# Patient Record
Sex: Male | Born: 1955 | Race: Black or African American | Hispanic: No | Marital: Single | Smoking: Current every day smoker
Health system: Southern US, Community
[De-identification: ages and names within clinical notes are randomized; demographics above are authoritative.]

## PROBLEM LIST (undated history)

## (undated) DIAGNOSIS — I10 Essential (primary) hypertension: Secondary | ICD-10-CM

## (undated) DIAGNOSIS — F1011 Alcohol abuse, in remission: Secondary | ICD-10-CM

---

## 2007-10-27 ENCOUNTER — Ambulatory Visit: Payer: Self-pay | Admitting: Internal Medicine

## 2007-11-18 ENCOUNTER — Ambulatory Visit: Payer: Self-pay | Admitting: Internal Medicine

## 2007-11-20 ENCOUNTER — Ambulatory Visit: Payer: Self-pay | Admitting: *Deleted

## 2018-02-13 DIAGNOSIS — I10 Essential (primary) hypertension: Secondary | ICD-10-CM | POA: Insufficient documentation

## 2018-02-13 DIAGNOSIS — F10920 Alcohol use, unspecified with intoxication, uncomplicated: Secondary | ICD-10-CM | POA: Insufficient documentation

## 2018-02-13 DIAGNOSIS — W010XXA Fall on same level from slipping, tripping and stumbling without subsequent striking against object, initial encounter: Secondary | ICD-10-CM | POA: Insufficient documentation

## 2018-02-13 DIAGNOSIS — F17200 Nicotine dependence, unspecified, uncomplicated: Secondary | ICD-10-CM | POA: Insufficient documentation

## 2018-02-13 NOTE — ED Triage Notes (Addendum)
PER EMS, PT ARRIVED TO HIS WORK PLACE INTOXICATED AND STUMBLING AROUND. PT CALLED EMS. PT STS HE HAD 5 BEERS TODAY. PER PT, PT FELL ONTO RIGHT KNEE AT HIS WORK PLACE. PT STS THAT IS THE SAME KNEE HE NEEDS A "TRANSPLANT" ON. AAOX4 IN TRIAGE.

## 2018-02-14 ENCOUNTER — Encounter (HOSPITAL_COMMUNITY): Payer: Self-pay | Admitting: Emergency Medicine

## 2018-02-14 ENCOUNTER — Other Ambulatory Visit: Payer: Self-pay

## 2018-02-14 ENCOUNTER — Emergency Department (HOSPITAL_COMMUNITY)
Admission: EM | Admit: 2018-02-14 | Discharge: 2018-02-14 | Disposition: A | Discharge status: Home / Self Care | Payer: Self-pay | Attending: Emergency Medicine | Admitting: Emergency Medicine

## 2018-02-14 DIAGNOSIS — F10920 Alcohol use, unspecified with intoxication, uncomplicated: Secondary | ICD-10-CM

## 2018-02-14 DIAGNOSIS — W19XXXA Unspecified fall, initial encounter: Secondary | ICD-10-CM

## 2018-02-14 HISTORY — DX: Essential (primary) hypertension: I10

## 2018-02-14 NOTE — ED Provider Notes (Signed)
[facility]-EMERGENCY DEPT Provider Note   CSN: [id number] Arrival date & time: 02/13/18  2234     History   Chief Complaint No chief complaint on file.   HPI William Pruitt is a 62 y.o. male.  Patient presents via EMS after a fall earlier tonight while at work. The patient denies injury or pain. He feels he lost his balance as the cause of the fall. He admits to drinking multiple beers today and that he drinks daily. No chest pain, SOB, recent illness, vomiting, diarrhea.   The history is provided by the patient. No language interpreter was used.    Past Medical History:  Diagnosis Date  . Hypertension     There are no active problems to display for this patient.   History reviewed. No pertinent surgical history.      Home Medications    Prior to Admission medications   Not on File    Family History History reviewed. No pertinent family history.  Social History Social History   Tobacco Use  . Smoking status: Current Every Day Smoker  . Smokeless tobacco: Never Used  Substance Use Topics  . Alcohol use: Yes    Comment: 4 beers per day  . Drug use: Not Currently     Allergies   Patient has no known allergies.   Review of Systems Review of Systems  Constitutional: Negative for chills and fever.  HENT: Negative.   Respiratory: Negative.   Cardiovascular: Negative.   Gastrointestinal: Negative.   Musculoskeletal: Negative.   Skin: Negative.   Neurological: Negative.      Physical Exam Updated Vital Signs BP (!) 156/102 (BP Location: Left Arm)   Pulse 77   Temp 98.1 F (36.7 C) (Oral)   Resp 20   SpO2 95%   Physical Exam  Constitutional: He is oriented to person, place, and time. He appears well-developed and well-nourished. No distress.  HENT:  Head: Normocephalic and atraumatic.  Neck: Normal range of motion. Neck supple.  Cardiovascular: Normal rate and regular rhythm.  Pulmonary/Chest: Effort normal and breath  sounds normal. He has no wheezes. He has no rales.  Abdominal: Soft. Bowel sounds are normal. There is no tenderness. There is no rebound and no guarding.  Musculoskeletal: Normal range of motion.  Neurological: He is alert and oriented to person, place, and time.  Skin: Skin is warm and dry. No rash noted.  Psychiatric: He has a normal mood and affect.  Nursing note and vitals reviewed.    ED Treatments / Results  Labs (all labs ordered are listed, but only abnormal results are displayed) Labs Reviewed - No data to display  EKG None  Radiology No results found.  Procedures Procedures (including critical care time)  Medications Ordered in ED Medications - No data to display   Initial Impression / Assessment and Plan / ED Course  I have reviewed the triage vital signs and the nursing notes.  Pertinent labs & imaging results that were available during my care of the patient were reviewed by me and considered in my medical decision making (see chart for details).     Patient is here after fall without apparent injury. The patient is intoxicated but coherent and oriented. He is ambulatory without unsteadiness. He is eating and drinking in the ED.   He is felt stable for discharge home.   Final Clinical Impressions(s) / ED Diagnoses   Final diagnoses:  None    ED Discharge Orders  None       Elpidio Anis, PA-C 02/14/18 0344    Paula Libra, MD 02/14/18 [phone]

## 2018-02-14 NOTE — ED Notes (Signed)
Pt states he was told he passed out at work and he didn't know why.

## 2018-02-14 NOTE — Discharge Instructions (Addendum)
Included with your discharge instructions are resources for outpatient alcohol treatment options if you are interested in stopping drinking. You can be discharged home and should follow up with your doctor as needed.

## 2019-03-24 ENCOUNTER — Encounter (HOSPITAL_COMMUNITY): Payer: Self-pay

## 2019-03-24 ENCOUNTER — Emergency Department (HOSPITAL_COMMUNITY): Payer: Self-pay

## 2019-03-24 ENCOUNTER — Other Ambulatory Visit: Payer: Self-pay

## 2019-03-24 ENCOUNTER — Emergency Department (HOSPITAL_COMMUNITY)
Admission: EM | Admit: 2019-03-24 | Discharge: 2019-03-24 | Disposition: A | Discharge status: Home / Self Care | Payer: Self-pay | Attending: Emergency Medicine | Admitting: Emergency Medicine

## 2019-03-24 DIAGNOSIS — W19XXXA Unspecified fall, initial encounter: Secondary | ICD-10-CM

## 2019-03-24 DIAGNOSIS — I10 Essential (primary) hypertension: Secondary | ICD-10-CM | POA: Insufficient documentation

## 2019-03-24 DIAGNOSIS — S63501A Unspecified sprain of right wrist, initial encounter: Secondary | ICD-10-CM | POA: Insufficient documentation

## 2019-03-24 DIAGNOSIS — F10920 Alcohol use, unspecified with intoxication, uncomplicated: Secondary | ICD-10-CM | POA: Insufficient documentation

## 2019-03-24 DIAGNOSIS — Y9301 Activity, walking, marching and hiking: Secondary | ICD-10-CM | POA: Insufficient documentation

## 2019-03-24 DIAGNOSIS — Y92488 Other paved roadways as the place of occurrence of the external cause: Secondary | ICD-10-CM | POA: Insufficient documentation

## 2019-03-24 DIAGNOSIS — F17200 Nicotine dependence, unspecified, uncomplicated: Secondary | ICD-10-CM | POA: Insufficient documentation

## 2019-03-24 DIAGNOSIS — Y998 Other external cause status: Secondary | ICD-10-CM | POA: Insufficient documentation

## 2019-03-24 DIAGNOSIS — W010XXA Fall on same level from slipping, tripping and stumbling without subsequent striking against object, initial encounter: Secondary | ICD-10-CM | POA: Insufficient documentation

## 2019-03-24 NOTE — ED Provider Notes (Signed)
[facility] Provider Note   CSN: [id number] Arrival date & time: 03/24/19  1430     History   Chief Complaint Chief Complaint  Patient presents with  . ETOH/.Fall    HPI William Pruitt is a 63 y.o. male who presents with a fall.  Past medical history significant for hypertension.  The patient states that he was walking across the road to get another beer and he tripped and fell over a pallet.  He states that bystanders were concerned because he was able unable to get himself up.  He states that he is having some pain in his right wrist.  He thinks he may have arthritis in that wrist which is why he was not able to get up.  He has been drinking today -he states he drank 1 beer but it was a big one.  He denies head injury, loss of consciousness, dizziness, neck pain, chest pain, shortness of breath, abdominal pain, left upper extremity pain, lower extremity pain.  He is not on any blood thinners.     HPI  Past Medical History:  Diagnosis Date  . Hypertension     There are no active problems to display for this patient.   History reviewed. No pertinent surgical history.      Home Medications    Prior to Admission medications   Not on File    Family History No family history on file.  Social History Social History   Tobacco Use  . Smoking status: Current Every Day Smoker  . Smokeless tobacco: Never Used  Substance Use Topics  . Alcohol use: Yes    Comment: 4 beers per day  . Drug use: Not Currently     Allergies   Patient has no known allergies.   Review of Systems Review of Systems  Constitutional: Negative for fever.  Respiratory: Negative for shortness of breath.   Cardiovascular: Negative for chest pain.  Gastrointestinal: Negative for abdominal pain.  Musculoskeletal: Positive for arthralgias. Negative for back pain and myalgias.  Neurological: Negative for weakness and headaches.  All other systems reviewed  and are negative.    Physical Exam Updated Vital Signs BP 131/81 (BP Location: Right Arm)   Pulse 79   Temp 98 F (36.7 C) (Oral)   Resp 19   SpO2 94%   Physical Exam Vitals signs and nursing note reviewed.  Constitutional:      General: He is not in acute distress.    Appearance: Normal appearance. He is well-developed. He is not ill-appearing.     Comments: Disheveled male in NAD. In C-collar. Mildly intoxicated  HENT:     Head: Normocephalic and atraumatic.  Eyes:     General: No scleral icterus.       Right eye: No discharge.        Left eye: No discharge.     Pupils: Pupils are equal, round, and reactive to light.     Comments: Brown sclera  Neck:     Musculoskeletal: Normal range of motion.  Cardiovascular:     Rate and Rhythm: Normal rate and regular rhythm.  Pulmonary:     Effort: Pulmonary effort is normal. No respiratory distress.     Breath sounds: Normal breath sounds.  Abdominal:     General: There is no distension.     Palpations: Abdomen is soft.     Tenderness: There is no abdominal tenderness.  Musculoskeletal:     Comments: Normal ROM of  the bilateral shoulders, elbow, wrists. 2+ radial pulses bilaterally.  Normal ROM of the bilateral hips and knees. No tenderness  Skin:    General: Skin is warm and dry.  Neurological:     Mental Status: He is alert and oriented to person, place, and time.  Psychiatric:        Mood and Affect: Mood normal.        Behavior: Behavior normal.      ED Treatments / Results  Labs (all labs ordered are listed, but only abnormal results are displayed) Labs Reviewed - No data to display  EKG None  Radiology No results found.  Procedures Procedures (including critical care time)  Medications Ordered in ED Medications - No data to display   Initial Impression / Assessment and Plan / ED Course  I have reviewed the triage vital signs and the nursing notes.  Pertinent labs & imaging results that were  available during my care of the patient were reviewed by me and considered in my medical decision making (see chart for details).  63 year old male presents with a fall and alcohol intoxication. He states his right wrist is hurting him a little but nothing else is bothering him today. There was no head injury. Vitals are normal. Will order Xray and have tech ambulate him.  5PM Pt was ambulated and was unsteady. Will continue to monitor.  6:12 PM Pt is asking to leave. He was able to ambulate to the bathroom without assistance per nursing. Xray is negative for fracture. He was given a wrist splint. Will d/c.   Final Clinical Impressions(s) / ED Diagnoses   Final diagnoses:  Fall, initial encounter  Alcoholic intoxication without complication ([place])  Sprain of right wrist, initial encounter    ED Discharge Orders    None       Recardo Evangelist, PA-C 03/24/19 1814    Gareth Morgan, MD 03/26/19 [phone]

## 2019-03-24 NOTE — ED Triage Notes (Signed)
Patient arrived by [facility]. Patient with ETOH and fell out of car he lives in. Denies injury.

## 2019-03-24 NOTE — ED Notes (Signed)
Tried ambulating pt from wheelchair to bed, pt stumbled this NT caught pt before falling and placed pt back into bed. Notified Kelly(PA)

## 2019-11-14 ENCOUNTER — Encounter (HOSPITAL_COMMUNITY): Payer: Self-pay | Admitting: Emergency Medicine

## 2019-11-14 ENCOUNTER — Other Ambulatory Visit: Payer: Self-pay

## 2019-11-14 ENCOUNTER — Emergency Department (HOSPITAL_COMMUNITY): Payer: Medicaid Other

## 2019-11-14 ENCOUNTER — Inpatient Hospital Stay (HOSPITAL_COMMUNITY)
Admission: EM | Admit: 2019-11-14 | Discharge: 2019-11-22 | DRG: 177 | Disposition: A | Discharge status: Home / Self Care | Payer: Medicaid Other | Attending: Internal Medicine | Admitting: Internal Medicine

## 2019-11-14 ENCOUNTER — Inpatient Hospital Stay (HOSPITAL_COMMUNITY): Payer: Medicaid Other

## 2019-11-14 DIAGNOSIS — E871 Hypo-osmolality and hyponatremia: Secondary | ICD-10-CM | POA: Diagnosis present

## 2019-11-14 DIAGNOSIS — R7401 Elevation of levels of liver transaminase levels: Secondary | ICD-10-CM | POA: Diagnosis present

## 2019-11-14 DIAGNOSIS — I10 Essential (primary) hypertension: Secondary | ICD-10-CM | POA: Diagnosis not present

## 2019-11-14 DIAGNOSIS — F17290 Nicotine dependence, other tobacco product, uncomplicated: Secondary | ICD-10-CM | POA: Diagnosis present

## 2019-11-14 DIAGNOSIS — J1282 Pneumonia due to coronavirus disease 2019: Secondary | ICD-10-CM

## 2019-11-14 DIAGNOSIS — Z590 Homelessness: Secondary | ICD-10-CM | POA: Diagnosis not present

## 2019-11-14 DIAGNOSIS — Z209 Contact with and (suspected) exposure to unspecified communicable disease: Secondary | ICD-10-CM | POA: Diagnosis not present

## 2019-11-14 DIAGNOSIS — F1020 Alcohol dependence, uncomplicated: Secondary | ICD-10-CM | POA: Diagnosis present

## 2019-11-14 DIAGNOSIS — J9601 Acute respiratory failure with hypoxia: Secondary | ICD-10-CM | POA: Diagnosis present

## 2019-11-14 DIAGNOSIS — R0602 Shortness of breath: Secondary | ICD-10-CM

## 2019-11-14 DIAGNOSIS — N179 Acute kidney failure, unspecified: Secondary | ICD-10-CM | POA: Diagnosis present

## 2019-11-14 DIAGNOSIS — F1010 Alcohol abuse, uncomplicated: Secondary | ICD-10-CM | POA: Diagnosis not present

## 2019-11-14 DIAGNOSIS — R05 Cough: Secondary | ICD-10-CM | POA: Diagnosis not present

## 2019-11-14 DIAGNOSIS — U071 COVID-19: Secondary | ICD-10-CM | POA: Diagnosis present

## 2019-11-14 HISTORY — DX: Alcohol abuse, in remission: F10.11

## 2019-11-14 LAB — D-DIMER, QUANTITATIVE: D-Dimer, Quant: 1.9 ug/mL-FEU — ABNORMAL HIGH (ref 0.00–0.50)

## 2019-11-14 LAB — COMPREHENSIVE METABOLIC PANEL
ALT: 40 U/L (ref 0–44)
AST: 51 U/L — ABNORMAL HIGH (ref 15–41)
Albumin: 2.3 g/dL — ABNORMAL LOW (ref 3.5–5.0)
Alkaline Phosphatase: 38 U/L (ref 38–126)
Anion gap: 13 (ref 5–15)
BUN: 30 mg/dL — ABNORMAL HIGH (ref 8–23)
CO2: 18 mmol/L — ABNORMAL LOW (ref 22–32)
Calcium: 8.8 mg/dL — ABNORMAL LOW (ref 8.9–10.3)
Chloride: 97 mmol/L — ABNORMAL LOW (ref 98–111)
Creatinine, Ser: 1.55 mg/dL — ABNORMAL HIGH (ref 0.61–1.24)
GFR calc Af Amer: 54 mL/min — ABNORMAL LOW (ref >60)
GFR calc non Af Amer: 46 mL/min — ABNORMAL LOW (ref >60)
Glucose, Bld: 118 mg/dL — ABNORMAL HIGH (ref 70–99)
Potassium: 3.7 mmol/L (ref 3.5–5.1)
Sodium: 128 mmol/L — ABNORMAL LOW (ref 135–145)
Total Bilirubin: 0.8 mg/dL (ref 0.3–1.2)
Total Protein: 7.3 g/dL (ref 6.5–8.1)

## 2019-11-14 LAB — POC SARS CORONAVIRUS 2 AG -  ED: SARS Coronavirus 2 Ag: POSITIVE — AB

## 2019-11-14 LAB — CBC WITH DIFFERENTIAL/PLATELET
Abs Immature Granulocytes: 0 10*3/uL (ref 0.00–0.07)
Basophils Absolute: 0 10*3/uL (ref 0.0–0.1)
Basophils Relative: 0 %
Eosinophils Absolute: 0 10*3/uL (ref 0.0–0.5)
Eosinophils Relative: 0 %
HCT: 41 % (ref 39.0–52.0)
Hemoglobin: 13.9 g/dL (ref 13.0–17.0)
Lymphocytes Relative: 4 %
Lymphs Abs: 0.4 10*3/uL — ABNORMAL LOW (ref 0.7–4.0)
MCH: 30.2 pg (ref 26.0–34.0)
MCHC: 33.9 g/dL (ref 30.0–36.0)
MCV: 89.1 fL (ref 80.0–100.0)
Monocytes Absolute: 1.2 10*3/uL — ABNORMAL HIGH (ref 0.1–1.0)
Monocytes Relative: 12 %
Neutro Abs: 8.4 10*3/uL — ABNORMAL HIGH (ref 1.7–7.7)
Neutrophils Relative %: 84 %
Platelets: 391 10*3/uL (ref 150–400)
RBC: 4.6 MIL/uL (ref 4.22–5.81)
RDW: 12.3 % (ref 11.5–15.5)
WBC: 10 10*3/uL (ref 4.0–10.5)
nRBC: 0 % (ref 0.0–0.2)
nRBC: 0 /100 WBC

## 2019-11-14 LAB — TRIGLYCERIDES: Triglycerides: 119 mg/dL (ref <150)

## 2019-11-14 LAB — LACTATE DEHYDROGENASE: LDH: 318 U/L — ABNORMAL HIGH (ref 98–192)

## 2019-11-14 LAB — FERRITIN: Ferritin: 1068 ng/mL — ABNORMAL HIGH (ref 24–336)

## 2019-11-14 LAB — FIBRINOGEN: Fibrinogen: >800 mg/dL — ABNORMAL HIGH (ref 210–475)

## 2019-11-14 LAB — LACTIC ACID, PLASMA: Lactic Acid, Venous: 1.1 mmol/L (ref 0.5–1.9)

## 2019-11-14 LAB — PROCALCITONIN: Procalcitonin: 0.45 ng/mL

## 2019-11-14 LAB — C-REACTIVE PROTEIN: CRP: 13.6 mg/dL — ABNORMAL HIGH (ref <1.0)

## 2019-11-14 LAB — ABO/RH: ABO/RH(D): A POS

## 2019-11-14 MED ORDER — SODIUM CHLORIDE 0.45 % IV SOLN: INTRAVENOUS | Status: DC

## 2019-11-14 MED ORDER — FOLIC ACID 1 MG PO TABS
1.0000 mg | ORAL_TABLET | Freq: Every day | ORAL | Status: DC
  Administered 2019-11-14 – 2019-11-22 (×9): 1 mg via ORAL
  Filled 2019-11-14 (×9): qty 1

## 2019-11-14 MED ORDER — ACETAMINOPHEN 325 MG PO TABS: 650.0000 mg | ORAL_TABLET | Freq: Four times a day (QID) | ORAL | Status: DC | PRN

## 2019-11-14 MED ORDER — TOCILIZUMAB 400 MG/20ML IV SOLN
700.0000 mg | Freq: Once | INTRAVENOUS | Status: DC
  Filled 2019-11-14: qty 35

## 2019-11-14 MED ORDER — SODIUM CHLORIDE 0.9 % IV SOLN
100.0000 mg | Freq: Every day | INTRAVENOUS | Status: AC
  Administered 2019-11-15 – 2019-11-18 (×4): 100 mg via INTRAVENOUS
  Filled 2019-11-14 (×4): qty 20

## 2019-11-14 MED ORDER — ENOXAPARIN SODIUM 40 MG/0.4ML ~~LOC~~ SOLN
40.0000 mg | SUBCUTANEOUS | Status: DC
  Administered 2019-11-14 – 2019-11-21 (×8): 40 mg via SUBCUTANEOUS
  Filled 2019-11-14 (×9): qty 0.4

## 2019-11-14 MED ORDER — ONDANSETRON HCL 4 MG PO TABS
4.0000 mg | ORAL_TABLET | Freq: Four times a day (QID) | ORAL | Status: DC | PRN
  Filled 2019-11-14: qty 1

## 2019-11-14 MED ORDER — DOCUSATE SODIUM 100 MG PO CAPS
100.0000 mg | ORAL_CAPSULE | Freq: Two times a day (BID) | ORAL | Status: DC
  Administered 2019-11-15 – 2019-11-22 (×15): 100 mg via ORAL
  Filled 2019-11-14 (×15): qty 1

## 2019-11-14 MED ORDER — SODIUM CHLORIDE 0.9 % IV SOLN
200.0000 mg | Freq: Once | INTRAVENOUS | Status: AC
  Administered 2019-11-14: 200 mg via INTRAVENOUS
  Filled 2019-11-14: qty 200

## 2019-11-14 MED ORDER — THIAMINE HCL 100 MG PO TABS
100.0000 mg | ORAL_TABLET | Freq: Every day | ORAL | Status: DC
  Administered 2019-11-14 – 2019-11-22 (×9): 100 mg via ORAL
  Filled 2019-11-14 (×9): qty 1

## 2019-11-14 MED ORDER — AMLODIPINE BESYLATE 5 MG PO TABS
5.0000 mg | ORAL_TABLET | Freq: Every day | ORAL | Status: DC
  Administered 2019-11-14 – 2019-11-15 (×2): 5 mg via ORAL
  Filled 2019-11-14 (×2): qty 1

## 2019-11-14 MED ORDER — ONDANSETRON HCL 4 MG/2ML IJ SOLN: 4.0000 mg | Freq: Four times a day (QID) | INTRAMUSCULAR | Status: DC | PRN

## 2019-11-14 MED ORDER — ADULT MULTIVITAMIN W/MINERALS CH
1.0000 | ORAL_TABLET | Freq: Every day | ORAL | Status: DC
  Administered 2019-11-14 – 2019-11-22 (×9): 1 via ORAL
  Filled 2019-11-14 (×9): qty 1

## 2019-11-14 MED ORDER — DEXAMETHASONE 6 MG PO TABS
6.0000 mg | ORAL_TABLET | ORAL | Status: AC
  Administered 2019-11-14 – 2019-11-17 (×4): 6 mg via ORAL
  Filled 2019-11-14: qty 1
  Filled 2019-11-14: qty 2
  Filled 2019-11-14 (×2): qty 1

## 2019-11-14 MED ORDER — TRAZODONE HCL 50 MG PO TABS
25.0000 mg | ORAL_TABLET | Freq: Every evening | ORAL | Status: DC | PRN
  Administered 2019-11-15: 21:00:00 25 mg via ORAL
  Filled 2019-11-14: qty 1

## 2019-11-14 NOTE — ED Triage Notes (Signed)
C/o cough and fever x 1 week.  Landlord's mother tested + for COVID and was in hospital.  Landlord threatened to kick him out if he didn't go to hospital and get a rapid COVID test.  EMS administered Tylenol 1000mg  for fever 101.6.  CBG 134.

## 2019-11-14 NOTE — ED Provider Notes (Signed)
[facility] EMERGENCY DEPARTMENT Provider Note   CSN: [id number] Arrival date & time: 11/14/19  1244     History Chief Complaint  Patient presents with  . ? COVID  . Cough  . Fever    William Pruitt is a 64 y.o. male.  Patient is a 64 year old gentleman with past medical history of hypertension presenting to the emergency department for Covid test.  Patient reports that he lives in a group home and that one of the members mother came to visit and she was positive for COVID-19.  He tells me that he has been feeling generally well but has a recorded fever at home. Denies GI symptoms. Reports occasional cough.        Past Medical History:  Diagnosis Date  . Hypertension     There are no problems to display for this patient.   History reviewed. No pertinent surgical history.     No family history on file.  Social History   Tobacco Use  . Smoking status: Current Every Day Smoker  . Smokeless tobacco: Never Used  Substance Use Topics  . Alcohol use: Yes    Comment: 4 beers per day  . Drug use: Not Currently    Home Medications Prior to Admission medications   Not on File    Allergies    Patient has no known allergies.  Review of Systems   Review of Systems  Constitutional: Negative for appetite change, chills, fatigue and fever.  HENT: Negative for congestion and sore throat.   Eyes: Negative for redness and visual disturbance.  Respiratory: Negative for cough and shortness of breath.   Cardiovascular: Negative for chest pain.  Gastrointestinal: Negative for abdominal pain, nausea and vomiting.  Genitourinary: Negative for dysuria.  Musculoskeletal: Negative for arthralgias and back pain.  Skin: Negative for rash.  Allergic/Immunologic: Negative for immunocompromised state.  Neurological: Negative for dizziness, light-headedness and headaches.  All other systems reviewed and are negative.   Physical Exam Updated Vital Signs BP (!)  148/90   Pulse 96   Temp 99 F (37.2 C) (Oral)   Resp (!) 22   SpO2 92%   Physical Exam Vitals and nursing note reviewed.  Constitutional:      General: He is not in acute distress.    Appearance: Normal appearance. He is not ill-appearing, toxic-appearing or diaphoretic.     Comments: Patient is unkempt and discheveled  HENT:     Head: Normocephalic.     Mouth/Throat:     Mouth: Mucous membranes are moist.  Eyes:     Conjunctiva/sclera: Conjunctivae normal.  Cardiovascular:     Rate and Rhythm: Normal rate and regular rhythm.  Pulmonary:     Effort: Pulmonary effort is normal.     Breath sounds: Normal breath sounds. No wheezing.  Chest:     Chest wall: No tenderness.  Abdominal:     General: Abdomen is flat.  Musculoskeletal:     Right lower leg: No edema.     Left lower leg: No edema.  Skin:    General: Skin is dry.     Capillary Refill: Capillary refill takes less than 2 seconds.  Neurological:     Mental Status: He is alert.  Psychiatric:        Mood and Affect: Mood normal.     ED Results / Procedures / Treatments   Labs (all labs ordered are listed, but only abnormal results are displayed) Labs Reviewed  POC SARS CORONAVIRUS  2 AG -  ED - Abnormal; Notable for the following components:      Result Value   SARS Coronavirus 2 Ag POSITIVE (*)    All other components within normal limits  CULTURE, BLOOD (ROUTINE X 2)  CULTURE, BLOOD (ROUTINE X 2)  CBC WITH DIFFERENTIAL/PLATELET  CBC WITH DIFFERENTIAL/PLATELET  LACTIC ACID, PLASMA  LACTIC ACID, PLASMA  D-DIMER, QUANTITATIVE (NOT AT [facility])  PROCALCITONIN  LACTATE DEHYDROGENASE  FERRITIN  FIBRINOGEN  C-REACTIVE PROTEIN  COMPREHENSIVE METABOLIC PANEL  HIV ANTIBODY (ROUTINE TESTING W REFLEX)  TRIGLYCERIDES  ABO/RH    EKG EKG Interpretation  Date/Time:  Sunday November 14 2019 13:14:34 EST Ventricular Rate:  94 PR Interval:    QRS Duration: 75 QT Interval:  366 QTC Calculation: 458 R Axis:   60  Text Interpretation: Sinus rhythm LAE, consider biatrial enlargement Left ventricular hypertrophy No old tracing to compare Confirmed by Jacalyn Lefevre [phone]) on 11/14/2019 3:21:06 PM   Radiology DG Chest Portable 1 View  Result Date: 11/14/2019 CLINICAL DATA:  Fever and cough for 1 week EXAM: PORTABLE CHEST 1 VIEW COMPARISON:  None. FINDINGS: The heart size and mediastinal contours are within normal limits accounting for technique. Mild to moderate bilateral lower lung predominant interstitial and airspace opacities are seen. There is a mild left basilar atelectasis. There is no pleural effusion or pneumothorax. The visualized skeletal structures are unremarkable. IMPRESSION: Mild to moderate bilateral lower lung predominant interstitial and airspace opacities are nonspecific but may reflect COVID-19 pneumonia. Electronically Signed   By: Romona Curls M.D.   On: 11/14/2019 14:58    Procedures Procedures (including critical care time)  Medications Ordered in ED Medications - No data to display  ED Course  I have reviewed the triage vital signs and the nursing notes.  Pertinent labs & imaging results that were available during my care of the patient were reviewed by me and considered in my medical decision making (see chart for details).  Clinical Course as of Nov 13 1541  [name] Nov 14, 2019  1435 Patient with Covid exposure presenting to the ED for cough and fever.  Oxygen saturations 93% on room air.  When I ambulate the patient he drops to 86% and has mild tachycardia and is tachypneic.  Point-of-care Covid test positive.  Labs and chest x-ray pending.   [KM]  1510 Xray consistent with covid 19. Will consult hospital for admission. Patient remaining stable 94% on RA   [KM]  1542 Patient will be transferred to [facility], admit by Dr. Ranell Patrick   [KM]    Clinical Course User Index [KM] [name]   MDM Rules/Calculators/A&P                      The patient appears  reasonably stabilized for admission considering the current resources, flow, and capabilities available in the ED at this time, and I doubt any other [facility] requiring further screening and/or treatment in the ED prior to admission.  Final Clinical Impression(s) / ED Diagnoses Final diagnoses:  COVID-19    Rx / DC Orders ED Discharge Orders    None       [name] 11/14/19 1543    Margarita Grizzle, MD 11/14/19 [phone]

## 2019-11-14 NOTE — H&P (Signed)
History and Physical    William Pruitt ZOX:[id number] DOB: 12-20-1955 DOA: 11/14/2019  PCP: Patient, No Pcp Per (Confirm with patient/family/NH records and if not entered, this has to be entered at [facility] point of entry) Patient coming from: boarding house  I have personally briefly reviewed patient's old medical records in [facility]  Chief Complaint: covid exposure, mild cough  HPI: William Pruitt is a 64 y.o. male with medical history significant of HTN who reports that a resident of his boarding house tested positive for Covid 19. He was not feeling sick but does endorse mild diarrhea, mild cough. He presented to [facility] ED to be tested. (For level 3, the HPI must include 4+ descriptors: Location, Quality, Severity, Duration, Timing, Context, modifying factors, associated signs/symptoms and/or status of 3+ chronic problems.)  (Please avoid self-populating past medical history here) (The initial 2-3 lines should be focused and good to copy and paste in the HPI section of the daily progress note).  ED Course: Mild hypertension noted. Lab revealed covid positive by PCR, elevated inflammatory markers with LDH 318, ferritin 1,068, CRP 13.6. CXR with bilateral lower lobe infiltrates c/w viral pneumonia. Patient was put to oxygen by nasal cannula at 2L/min. He is now admitted to [facility] for management of covid 19 [place].  Review of Systems: As per HPI otherwise 10 point review of systems negative.  Unacceptable ROS statements: "10 systems reviewed," "Extensive" (without elaboration).  Acceptable ROS statements: "All others negative," "All others reviewed and are negative," and "All others unremarkable," with at LEAST ONE ROS documented Can't double dip - if using for HPI can't use for ROS  Past Medical History:  Diagnosis Date  . H/O ETOH abuse   . Hypertension     History reviewed. No pertinent surgical history.   reports that he has been smoking. He has never used smokeless tobacco. He  reports current alcohol use. He reports previous drug use.  Last drink approximately 2 days ago - consumption limited by lack of funds Smokes two cigarillos a day - limited smoking due to lack of funds.  No Known Allergies  No family history on file.   Soc Hx - [employer], 2 years of college. 6 Years [place] [employer] - honorable discharge with rank of sargent. Never married. Has one daughter from whom he is estranged. Worked in Aeronautical engineer and other jobs. Lives in a boarding house. Gets care for HTN from [place].  Unacceptable: Noncontributory, unremarkable, or negative. Acceptable: Family history reviewed and not pertinent (If you reviewed it)  Prior to Admission medications   Not on File    Physical Exam: Vitals:   11/14/19 1255 11/14/19 1315 11/14/19 1345 11/14/19 1430  BP: (!) 154/93 (!) 151/93 (!) 152/96 (!) 148/90  Pulse: (!) 103 94 86 96  Resp: 18  (!) 22 (!) 22  Temp: 99 F (37.2 C)     TempSrc: Oral     SpO2: 94% 94% 93% 92%    Constitutional: NAD, calm, comfortable Vitals:   11/14/19 1255 11/14/19 1315 11/14/19 1345 11/14/19 1430  BP: (!) 154/93 (!) 151/93 (!) 152/96 (!) 148/90  Pulse: (!) 103 94 86 96  Resp: 18  (!) 22 (!) 22  Temp: 99 F (37.2 C)     TempSrc: Oral     SpO2: 94% 94% 93% 92%   General  WNWD man in no distress Eyes: PERRL, lids  Normal. Bilateral arcus senilis. Bulbar conjunctiva muddy ENMT: Mucous membranes are moist. Posterior pharynx clear of any  exudate or lesions. Missing many teeth but has front teeth mandible and maxilla..  Neck: normal, supple, no masses, no thyromegaly Respiratory: mild increased WOB using abdominal muscles, no neck retractions. Normal air movement with bilateral basilar rales, no wheezing. Cardiovascular: Regular rate and rhythm, no murmurs / rubs / gallops. No extremity edema. 2+ pedal pulses. No carotid bruits.  Abdomen: no tenderness, no masses palpated. No hepatosplenomegaly. Bowel sounds positive.  Musculoskeletal: no clubbing /  cyanosis. No joint deformity upper and lower extremities. Good ROM, no contractures. Normal muscle tone.  Skin: no rashes, lesions, ulcers. No induration Neurologic: CN 2-12 grossly intact. Sensation intact.. Strength 5/5 in all 4.  Psychiatric: Normal judgment and insight. Alert and oriented x 3. Normal mood.   (Anything < 9 systems with 2 bullets each down codes to level 1) (If patient refuses exam can't bill higher level) (Make sure to document decubitus ulcers present on admission -- if possible -- and whether patient has chronic indwelling catheter at time of admission)  Labs on Admission: I have personally reviewed following labs and imaging studies  CBC: Recent Labs  Lab 11/14/19 1452  WBC 10.0  NEUTROABS 8.4*  HGB 13.9  HCT 41.0  MCV 89.1  PLT 024   Basic Metabolic Panel: Recent Labs  Lab 11/14/19 1452  NA 128*  K 3.7  CL 97*  CO2 18*  GLUCOSE 118*  BUN 30*  CREATININE 1.55*  CALCIUM 8.8*   GFR: CrCl cannot be calculated (Unknown ideal weight.). Liver Function Tests: Recent Labs  Lab 11/14/19 1452  AST 51*  ALT 40  ALKPHOS 38  BILITOT 0.8  PROT 7.3  ALBUMIN 2.3*   No results for input(s): LIPASE, AMYLASE in the last 168 hours. No results for input(s): AMMONIA in the last 168 hours. Coagulation Profile: No results for input(s): INR, PROTIME in the last 168 hours. Cardiac Enzymes: No results for input(s): CKTOTAL, CKMB, CKMBINDEX, TROPONINI in the last 168 hours. BNP (last 3 results) No results for input(s): PROBNP in the last 8760 hours. HbA1C: No results for input(s): HGBA1C in the last 72 hours. CBG: No results for input(s): GLUCAP in the last 168 hours. Lipid Profile: Recent Labs    11/14/19 1452  TRIG 119   Thyroid Function Tests: No results for input(s): TSH, T4TOTAL, FREET4, T3FREE, THYROIDAB in the last 72 hours. Anemia Panel: Recent Labs    11/14/19 1452  FERRITIN 1,068*   Urine analysis: No results found for: COLORURINE,  APPEARANCEUR, LABSPEC, PHURINE, GLUCOSEU, HGBUR, BILIRUBINUR, KETONESUR, PROTEINUR, UROBILINOGEN, NITRITE, LEUKOCYTESUR  Radiological Exams on Admission: DG Chest Portable 1 View  Result Date: 11/14/2019 CLINICAL DATA:  Fever and cough for 1 week EXAM: PORTABLE CHEST 1 VIEW COMPARISON:  None. FINDINGS: The heart size and mediastinal contours are within normal limits accounting for technique. Mild to moderate bilateral lower lung predominant interstitial and airspace opacities are seen. There is a mild left basilar atelectasis. There is no pleural effusion or pneumothorax. The visualized skeletal structures are unremarkable. IMPRESSION: Mild to moderate bilateral lower lung predominant interstitial and airspace opacities are nonspecific but may reflect COVID-19 pneumonia. Electronically Signed   By: Zerita Boers M.D.   On: 11/14/2019 14:58    EKG: Independently reviewed. NSR, LAE, LVH, no acute changes  Assessment/Plan Active Problems:   Pneumonia due to COVID-19 virus   ETOH abuse  (please populate well all problems here in Problem List. (For example, if patient is on BP meds at home and you resume or decide to hold  them, it is a problem that needs to be her. Same for CAD, COPD, HLD and so on)   1. Covid Pneumonia - Covid +, bibasilar infiltrates, elevated inflammatory markers, mild increased WOB, requiring 2L O2. Plan [facility] admit  Remdesivir  Decadron  Actemra  O2 to keep sats >90%  F/u CBC, Bmet, CRP  2. HTN- patient reports taking medication but does not know name. Has mild HTN in ED Plan Amlodipine 5 mg daily  3. EtOH - last drink >48 hrs ago. No history of withdrawal.   DVT prophylaxis: Lovenox (Lovenox/Heparin/SCD's/anticoagulated/None (if comfort care) Code Status: full code (Full/Partial (specify details) Family Communication: no emergency contact listed. He would like his middle brother as contact but could not recall tele # (Specify name, relationship. Do not write  "discussed with patient". Specify tel # if discussed over the phone) Disposition Plan: home when stable - doubt he has resources to be able to return for any outpatient infusion therapy (specify when and where you expect patient to be discharged) Consults called: none (with names) Admission status: inpatient (inpatient / obs / tele / medical floor / SDU)   Illene Regulus MD [facility] Pager [phone]  If 7PM-7AM, please contact night-coverage [email or web address] Password [facility]  11/14/2019, 4:25 PM

## 2019-11-14 NOTE — ED Notes (Signed)
Attempted to call nursing report.  

## 2019-11-14 NOTE — Progress Notes (Signed)
Patient admitted to room 158.  History and admission completed.  Admitted to tele monitor.  Patient is a fall risk because he has a history of falls.  Patient refused the yellow socks, wristband, and bed alarm.  Education provided but continued to refuse the safety measures.  States he will call out if he needs to get up but refused all safety measures to prevent a fall.  Patient complained of being very hungry.  Two microwave dinners provided.  Patient states he does not have any trouble having bowel movements and does not want the colace ordered for tonight.  Patient states he is not trying to be difficult but he is not going to do things out of his norm or "not needed."

## 2019-11-14 NOTE — ED Notes (Signed)
Date and time results received: 11/14/19  Test: Covid Critical Value: positive  Name of Provider Notified:Kelly PA

## 2019-11-15 LAB — CBC WITH DIFFERENTIAL/PLATELET
Abs Immature Granulocytes: 0.22 10*3/uL — ABNORMAL HIGH (ref 0.00–0.07)
Basophils Absolute: 0.1 10*3/uL (ref 0.0–0.1)
Basophils Relative: 1 %
Eosinophils Absolute: 0 10*3/uL (ref 0.0–0.5)
Eosinophils Relative: 0 %
HCT: 40.9 % (ref 39.0–52.0)
Hemoglobin: 13.9 g/dL (ref 13.0–17.0)
Immature Granulocytes: 3 %
Lymphocytes Relative: 7 %
Lymphs Abs: 0.6 10*3/uL — ABNORMAL LOW (ref 0.7–4.0)
MCH: 30.3 pg (ref 26.0–34.0)
MCHC: 34 g/dL (ref 30.0–36.0)
MCV: 89.3 fL (ref 80.0–100.0)
Monocytes Absolute: 0.7 10*3/uL (ref 0.1–1.0)
Monocytes Relative: 8 %
Neutro Abs: 7.4 10*3/uL (ref 1.7–7.7)
Neutrophils Relative %: 81 %
Platelets: 437 10*3/uL — ABNORMAL HIGH (ref 150–400)
RBC: 4.58 MIL/uL (ref 4.22–5.81)
RDW: 12.4 % (ref 11.5–15.5)
WBC: 9 10*3/uL (ref 4.0–10.5)
nRBC: 0 % (ref 0.0–0.2)

## 2019-11-15 LAB — COMPREHENSIVE METABOLIC PANEL
ALT: 36 U/L (ref 0–44)
AST: 41 U/L (ref 15–41)
Albumin: 2.7 g/dL — ABNORMAL LOW (ref 3.5–5.0)
Alkaline Phosphatase: 41 U/L (ref 38–126)
Anion gap: 12 (ref 5–15)
BUN: 30 mg/dL — ABNORMAL HIGH (ref 8–23)
CO2: 21 mmol/L — ABNORMAL LOW (ref 22–32)
Calcium: 8.8 mg/dL — ABNORMAL LOW (ref 8.9–10.3)
Chloride: 96 mmol/L — ABNORMAL LOW (ref 98–111)
Creatinine, Ser: 1.22 mg/dL (ref 0.61–1.24)
GFR calc Af Amer: >60 mL/min (ref >60)
GFR calc non Af Amer: >60 mL/min (ref >60)
Glucose, Bld: 146 mg/dL — ABNORMAL HIGH (ref 70–99)
Potassium: 3.5 mmol/L (ref 3.5–5.1)
Sodium: 129 mmol/L — ABNORMAL LOW (ref 135–145)
Total Bilirubin: 0.5 mg/dL (ref 0.3–1.2)
Total Protein: 7.4 g/dL (ref 6.5–8.1)

## 2019-11-15 LAB — C-REACTIVE PROTEIN: CRP: 14.4 mg/dL — ABNORMAL HIGH (ref <1.0)

## 2019-11-15 MED ORDER — AMLODIPINE BESYLATE 5 MG PO TABS
5.0000 mg | ORAL_TABLET | Freq: Once | ORAL | Status: AC
  Administered 2019-11-15: 5 mg via ORAL
  Filled 2019-11-15: qty 1

## 2019-11-15 MED ORDER — AMLODIPINE BESYLATE 10 MG PO TABS
10.0000 mg | ORAL_TABLET | Freq: Every day | ORAL | Status: DC
  Administered 2019-11-16 – 2019-11-17 (×2): 10 mg via ORAL
  Filled 2019-11-15 (×2): qty 1

## 2019-11-15 MED ORDER — HYDRALAZINE HCL 25 MG PO TABS
50.0000 mg | ORAL_TABLET | Freq: Three times a day (TID) | ORAL | Status: DC
  Administered 2019-11-15 – 2019-11-17 (×7): 50 mg via ORAL
  Filled 2019-11-15 (×7): qty 2

## 2019-11-15 NOTE — Progress Notes (Signed)
When using urinal today, pt has dribbled and gotten sheets and gown damp. Odor is occurring. Have attempted x3 to get pt to bathe and he refuses. Continued encouragement provided. Pt reports "I am fine and I will wash up tonight or in the morning".

## 2019-11-15 NOTE — Progress Notes (Signed)
Brother is updated and all questions answered.

## 2019-11-15 NOTE — Progress Notes (Signed)
PROGRESS NOTE  William Pruitt  KPT:[id number] DOB: 1956/01/06 DOA: 11/14/2019 PCP: [facility] Narrative: William Pruitt is a 64 y.o. male with a history of HTN who presented to the ED 2/21 with mild cough and diarrhea after an exposure to covid-positive resident from a boarding house. He appeared hypertensive without fever but was hypoxemic requiring 2L O2 in the ED. CXR showed infiltrates and SARS-CoV-2 antigen testing was positive. CRP 13.6, PCT 0.45. Remdesivir, steroids were given and the patient admitted to [facility].  Assessment & Plan: Active Problems:   ETOH abuse   Pneumonia due to COVID-19 virus  Acute hypoxemic respiratory failure due to covid-19 pneumonia: SARS-CoV-2 Ag positive on 2/21. Note he is at high risk for deterioration at this point in time course of illness and with infiltrates already evident on CXR. - Continue remdesivir x5 days (2/21 - 2/25) - Steroids x10 days - Note procalcitonin elevated, though no leukocytosis noted. Will check sputum culture but hold abx for now. - CRP remains elevated, will have low threshold to give tocilizumab if hypoxemia advances, currently weaned to room air - Vitamin C, zinc - Encourage OOB, IS, FV, and awake proning if able - Tylenol and antitussives prn - Continue airborne, contact precautions for 21 days from positive testing. - Enoxaparin prophylactic dose.  - Maintain euvolemia/net negative.  - Avoid NSAIDs   HTN:  - Continue norvasc in lieu of patient's unknown home medication, increase dose to 10mg . - Will add hydralazine q8h, change to/add clonidine if there is evidence of alcohol withdrawal.  AKI: SCr 1.55 on admission, declining today.  - Taking 100% meals, stop IVF.  Hyponatremia:  - Continue monitoring  AST elevation: Due to EtOH vs. covid infection. Improved.  - Continue monitoring while giving remdesivir.  History of alcohol abuse: Reports last drink >48hrs from admission. No alcohol level or UDS obtained on  day of admission. - Monitor clinically for withdrawal.  - Empiric thiamine/folate/MVM  DVT prophylaxis: Lovenox Code Status: Full Family Communication: None  Disposition Plan: Patient to return to group home once clinical improvement noted.   Consultants:   None  Procedures:   None  Antimicrobials:  Remdesivir   Subjective: No shortness of breath or chest pain, eating well. No report of diarrhea. Feels vaguely weak.   Objective: Vitals:   11/15/19 0457 11/15/19 0700 11/15/19 0825 11/15/19 0850  BP: (!) 164/100 (!) 188/123 (!) 188/123 (!) 171/106  Pulse: 76  81 83  Resp: 18     Temp: 98.7 F (37.1 C) 98.7 F (37.1 C) 98.7 F (37.1 C)   TempSrc: Oral Oral Oral   SpO2: 100%   100%  Weight:      Height:        Intake/Output Summary (Last 24 hours) at 11/15/2019 1118 Last data filed at 11/15/2019 0900 Gross per 24 hour  Intake 1540 ml  Output 600 ml  Net 940 ml   Filed Weights   11/14/19 1637  Weight: 88.5 kg    Gen: Chronically ill-appearing male in no distress Pulm: Non-labored breathing. Crackles bilaterally.  CV: Regular rate and rhythm. No murmur, rub, or gallop. No JVD, no pedal edema. GI: Abdomen soft, non-tender, non-distended, with normoactive bowel sounds. No organomegaly or masses felt. Ext: Warm, no deformities Skin: No rashes, lesions or ulcers Neuro: Alert and oriented. No focal neurological deficits. Psych: Judgement and insight appear normal. Mood & affect appropriate.   Data Reviewed: I have personally reviewed following labs and imaging studies  CBC:  Recent Labs  Lab 11/14/19 1452 11/15/19 0235  WBC 10.0 9.0  NEUTROABS 8.4* 7.4  HGB 13.9 13.9  HCT 41.0 40.9  MCV 89.1 89.3  PLT 391 536*   Basic Metabolic Panel: Recent Labs  Lab 11/14/19 1452 11/15/19 0235  NA 128* 129*  K 3.7 3.5  CL 97* 96*  CO2 18* 21*  GLUCOSE 118* 146*  BUN 30* 30*  CREATININE 1.55* 1.22  CALCIUM 8.8* 8.8*   GFR: Estimated Creatinine Clearance:  68.2 mL/min (by C-G formula based on SCr of 1.22 mg/dL). Liver Function Tests: Recent Labs  Lab 11/14/19 1452 11/15/19 0235  AST 51* 41  ALT 40 36  ALKPHOS 38 41  BILITOT 0.8 0.5  PROT 7.3 7.4  ALBUMIN 2.3* 2.7*   No results for input(s): LIPASE, AMYLASE in the last 168 hours. No results for input(s): AMMONIA in the last 168 hours. Coagulation Profile: No results for input(s): INR, PROTIME in the last 168 hours. Cardiac Enzymes: No results for input(s): CKTOTAL, CKMB, CKMBINDEX, TROPONINI in the last 168 hours. BNP (last 3 results) No results for input(s): PROBNP in the last 8760 hours. HbA1C: No results for input(s): HGBA1C in the last 72 hours. CBG: No results for input(s): GLUCAP in the last 168 hours. Lipid Profile: Recent Labs    11/14/19 1452  TRIG 119   Thyroid Function Tests: No results for input(s): TSH, T4TOTAL, FREET4, T3FREE, THYROIDAB in the last 72 hours. Anemia Panel: Recent Labs    11/14/19 1452  FERRITIN 1,068*   Urine analysis: No results found for: COLORURINE, APPEARANCEUR, LABSPEC, PHURINE, GLUCOSEU, HGBUR, BILIRUBINUR, KETONESUR, PROTEINUR, UROBILINOGEN, NITRITE, LEUKOCYTESUR Recent Results (from the past 240 hour(s))  Blood Culture (routine x 2)     Status: None (Preliminary result)   Collection Time: 11/14/19  3:19 PM   Specimen: BLOOD  Result Value Ref Range Status   Specimen Description BLOOD RIGHT ANTECUBITAL  Final   Special Requests   Final    BOTTLES DRAWN AEROBIC AND ANAEROBIC Blood Culture adequate volume   Culture   Final    NO GROWTH < 24 HOURS Performed at [facility], [address], [place], [place] [address]    Report Status PENDING  Incomplete  Blood Culture (routine x 2)     Status: None (Preliminary result)   Collection Time: 11/14/19  3:36 PM   Specimen: BLOOD LEFT WRIST  Result Value Ref Range Status   Specimen Description BLOOD LEFT WRIST  Final   Special Requests   Final    BOTTLES DRAWN AEROBIC AND  ANAEROBIC Blood Culture adequate volume   Culture   Final    NO GROWTH < 12 HOURS Performed at [facility], [place] [address] [place], [place] [address]    Report Status PENDING  Incomplete      Radiology Studies: Portable chest 1 View  Result Date: 11/14/2019 CLINICAL DATA:  64 year old male with history of shortness of breath and labored breathing. EXAM: PORTABLE CHEST 1 VIEW COMPARISON:  Chest x-ray 11/14/2019. FINDINGS: Low lung volumes. Patchy ill-defined opacities and areas of interstitial prominence throughout the mid to lower lungs bilaterally. No pleural effusions. No pneumothorax. No evidence of pulmonary edema. Heart size is normal. Upper mediastinal contours are within normal limits. IMPRESSION: 1. Multilobar bilateral pneumonia with similar aeration compared to the prior study, as above. 2. Low lung volumes. Electronically Signed   By: Vinnie Langton M.D.   On: 11/14/2019 19:53   DG Chest Portable 1 View  Result Date: 11/14/2019 CLINICAL DATA:  Fever and cough for 1 week EXAM: PORTABLE CHEST 1 VIEW COMPARISON:  None. FINDINGS: The heart size and mediastinal contours are within normal limits accounting for technique. Mild to moderate bilateral lower lung predominant interstitial and airspace opacities are seen. There is a mild left basilar atelectasis. There is no pleural effusion or pneumothorax. The visualized skeletal structures are unremarkable. IMPRESSION: Mild to moderate bilateral lower lung predominant interstitial and airspace opacities are nonspecific but may reflect COVID-19 pneumonia. Electronically Signed   By: Romona Curls M.D.   On: 11/14/2019 14:58    Scheduled Meds: . amLODipine  5 mg Oral Daily  . dexamethasone  6 mg Oral Q24H  . docusate sodium  100 mg Oral BID  . enoxaparin (LOVENOX) injection  40 mg Subcutaneous Q24H  . folic acid  1 mg Oral Daily  . multivitamin with minerals  1 tablet Oral Daily  . thiamine  100 mg Oral Daily   Continuous  Infusions: . sodium chloride 75 mL/hr at 11/15/19 0455  . remdesivir 100 mg in NS 100 mL 100 mg (11/15/19 0855)  . tocilizumab (ACTEMRA) - non-COVID treatment       LOS: 1 day   Time spent: 35 minutes.  Tyrone Nine, MD [facility] [email or web address] 11/15/2019, 11:18 AM

## 2019-11-16 LAB — COMPREHENSIVE METABOLIC PANEL
ALT: 33 U/L (ref 0–44)
AST: 33 U/L (ref 15–41)
Albumin: 2.4 g/dL — ABNORMAL LOW (ref 3.5–5.0)
Alkaline Phosphatase: 40 U/L (ref 38–126)
Anion gap: 12 (ref 5–15)
BUN: 21 mg/dL (ref 8–23)
CO2: 19 mmol/L — ABNORMAL LOW (ref 22–32)
Calcium: 9.3 mg/dL (ref 8.9–10.3)
Chloride: 100 mmol/L (ref 98–111)
Creatinine, Ser: 1.07 mg/dL (ref 0.61–1.24)
GFR calc Af Amer: >60 mL/min (ref >60)
GFR calc non Af Amer: >60 mL/min (ref >60)
Glucose, Bld: 143 mg/dL — ABNORMAL HIGH (ref 70–99)
Potassium: 3.5 mmol/L (ref 3.5–5.1)
Sodium: 131 mmol/L — ABNORMAL LOW (ref 135–145)
Total Bilirubin: 0.5 mg/dL (ref 0.3–1.2)
Total Protein: 7.3 g/dL (ref 6.5–8.1)

## 2019-11-16 LAB — CBC WITH DIFFERENTIAL/PLATELET
Abs Immature Granulocytes: 0.58 10*3/uL — ABNORMAL HIGH (ref 0.00–0.07)
Basophils Absolute: 0.1 10*3/uL (ref 0.0–0.1)
Basophils Relative: 1 %
Eosinophils Absolute: 0 10*3/uL (ref 0.0–0.5)
Eosinophils Relative: 0 %
HCT: 41.1 % (ref 39.0–52.0)
Hemoglobin: 13.9 g/dL (ref 13.0–17.0)
Immature Granulocytes: 5 %
Lymphocytes Relative: 7 %
Lymphs Abs: 0.8 10*3/uL (ref 0.7–4.0)
MCH: 30.3 pg (ref 26.0–34.0)
MCHC: 33.8 g/dL (ref 30.0–36.0)
MCV: 89.7 fL (ref 80.0–100.0)
Monocytes Absolute: 0.9 10*3/uL (ref 0.1–1.0)
Monocytes Relative: 8 %
Neutro Abs: 9.1 10*3/uL — ABNORMAL HIGH (ref 1.7–7.7)
Neutrophils Relative %: 79 %
Platelets: 528 10*3/uL — ABNORMAL HIGH (ref 150–400)
RBC: 4.58 MIL/uL (ref 4.22–5.81)
RDW: 12.4 % (ref 11.5–15.5)
WBC: 11.6 10*3/uL — ABNORMAL HIGH (ref 4.0–10.5)
nRBC: 0 % (ref 0.0–0.2)

## 2019-11-16 LAB — GLUCOSE, CAPILLARY
Glucose-Capillary: 108 mg/dL — ABNORMAL HIGH (ref 70–99)
Glucose-Capillary: 124 mg/dL — ABNORMAL HIGH (ref 70–99)
Glucose-Capillary: 107 mg/dL — ABNORMAL HIGH (ref 70–99)

## 2019-11-16 LAB — HEPATITIS B SURFACE ANTIGEN: Hepatitis B Surface Ag: NEGATIVE — AB

## 2019-11-16 LAB — HEPATITIS B SURFACE ANTIBODY, QUANTITATIVE: Hep B S AB Quant (Post): <3.1 m[IU]/mL — ABNORMAL LOW (ref >9.9)

## 2019-11-16 LAB — HIV ANTIBODY (ROUTINE TESTING W REFLEX): HIV Screen 4th Generation wRfx: NONREACTIVE — AB

## 2019-11-16 LAB — C-REACTIVE PROTEIN: CRP: 7.5 mg/dL — ABNORMAL HIGH (ref <1.0)

## 2019-11-16 MED ORDER — CLONIDINE HCL 0.1 MG PO TABS
0.1000 mg | ORAL_TABLET | Freq: Three times a day (TID) | ORAL | Status: DC
  Administered 2019-11-16 – 2019-11-17 (×4): 0.1 mg via ORAL
  Filled 2019-11-16 (×4): qty 1

## 2019-11-16 NOTE — Progress Notes (Signed)
PROGRESS NOTE  William Pruitt  EXH:[id number] DOB: 08/31/1956 DOA: 11/14/2019 PCP: [facility] Brief Narrative: William Pruitt is a 64 y.o. male with a history of HTN who presented to the ED 2/21 with mild cough and diarrhea after an exposure to covid-positive resident from a boarding house. He appeared hypertensive without fever but was hypoxemic requiring 2L O2 in the ED. CXR showed infiltrates and SARS-CoV-2 antigen testing was positive. CRP 13.6, PCT 0.45. Remdesivir, steroids were given and the patient admitted to [facility].  Assessment & Plan: Active Problems:   ETOH abuse   Pneumonia due to COVID-19 virus  Acute hypoxemic respiratory failure due to covid-19 pneumonia: SARS-CoV-2 Ag positive on 2/21. Note he is at high risk for deterioration at this point in time course of illness and with infiltrates already evident on CXR. - Continue remdesivir x5 days (2/21 - 2/25) - Continue steroids x10 days - Note procalcitonin elevated, though no leukocytosis noted. Will check sputum culture but hold abx for now. - CRP showing improvement and no advancing hypoxemia. Will continue steroids at this dose, no indication for off label or investigational therapies at this time.  - Vitamin C, zinc - Encourage OOB, IS, FV, and awake proning if able - Tylenol and antitussives prn - Continue airborne, contact precautions for 21 days from positive testing. - Enoxaparin prophylactic dose.  - Maintain euvolemia/net negative.  - Avoid NSAIDs   HTN: Remains severely elevated.  - Continue new and augmented norvasc 10mg .  - Continue new hydralazine 50mg  q8h - Add clonidine.   AKI: SCr 1.55 on admission, showing improvement even after DC'ing IVF. - Avoid nephrotoxins.  Hyponatremia: Improving somewhat. - Continue monitoring  AST elevation: Due to EtOH vs. covid infection. Improved.  - Continue monitoring while giving remdesivir.   History of alcohol abuse: Reports last drink >48hrs from admission. No  alcohol level or UDS obtained on day of admission. Not showing evidence of withdrawal currently. - Monitor clinically for withdrawal.  - Empiric thiamine/folate/MVM  DVT prophylaxis: Lovenox Code Status: Full Family Communication: None  Disposition Plan: Patient to return to group home once clinical improvement noted. Will consult CSW as his living arrangements may not be available at this time.  Consultants:   None  Procedures:   None  Antimicrobials:  Remdesivir   Subjective: Patient feels somewhat weak, but appetite has picked up. Denies shortness of breath or chest pain. No palpitations, headache, vision changes or leg swelling/pain.   Objective: Vitals:   11/15/19 1912 11/16/19 0414 11/16/19 0456 11/16/19 0725  BP: (!) 165/100 (!) 157/108  (!) 160/103  Pulse: 93  74 86  Resp: 18 18  18   Temp: 98.6 F (37 C) 98.3 F (36.8 C)  98.4 F (36.9 C)  TempSrc: Oral Oral  Oral  SpO2: 98%  100% 98%  Weight:      Height:        Intake/Output Summary (Last 24 hours) at 11/16/2019 0853 Last data filed at 11/16/2019 0300 Gross per 24 hour  Intake 1180 ml  Output 2050 ml  Net -870 ml   Filed Weights   11/14/19 1637  Weight: 88.5 kg   Gen: Chronically ill-appaering male in no distress Pulm: Nonlabored breathing room air. Still has bilateral crackles. CV: Regular rate and rhythm. No murmur, rub, or gallop. No JVD, no dependent edema. GI: Abdomen soft, non-tender, non-distended, with normoactive bowel sounds.  Ext: Warm, no deformities Skin: No rashes, lesions or ulcers on visualized skin. Neuro: Alert and oriented.  No focal neurological deficits. Psych: Judgement and insight appear fair. Mood euthymic & affect congruent. Behavior is appropriate.    Data Reviewed: I have personally reviewed following labs and imaging studies  CBC: Recent Labs  Lab 11/14/19 1452 11/15/19 0235 11/16/19 0250  WBC 10.0 9.0 11.6*  NEUTROABS 8.4* 7.4 9.1*  HGB 13.9 13.9 13.9  HCT 41.0  40.9 41.1  MCV 89.1 89.3 89.7  PLT 391 437* 811*   Basic Metabolic Panel: Recent Labs  Lab 11/14/19 1452 11/15/19 0235 11/16/19 0250  NA 128* 129* 131*  K 3.7 3.5 3.5  CL 97* 96* 100  CO2 18* 21* 19*  GLUCOSE 118* 146* 143*  BUN 30* 30* 21  CREATININE 1.55* 1.22 1.07  CALCIUM 8.8* 8.8* 9.3   GFR: Estimated Creatinine Clearance: 77.8 mL/min (by C-G formula based on SCr of 1.07 mg/dL). Liver Function Tests: Recent Labs  Lab 11/14/19 1452 11/15/19 0235 11/16/19 0250  AST 51* 41 33  ALT 40 36 33  ALKPHOS 38 41 40  BILITOT 0.8 0.5 0.5  PROT 7.3 7.4 7.3  ALBUMIN 2.3* 2.7* 2.4*   No results for input(s): LIPASE, AMYLASE in the last 168 hours. No results for input(s): AMMONIA in the last 168 hours. Coagulation Profile: No results for input(s): INR, PROTIME in the last 168 hours. Cardiac Enzymes: No results for input(s): CKTOTAL, CKMB, CKMBINDEX, TROPONINI in the last 168 hours. BNP (last 3 results) No results for input(s): PROBNP in the last 8760 hours. HbA1C: No results for input(s): HGBA1C in the last 72 hours. CBG: No results for input(s): GLUCAP in the last 168 hours. Lipid Profile: Recent Labs    11/14/19 1452  TRIG 119   Thyroid Function Tests: No results for input(s): TSH, T4TOTAL, FREET4, T3FREE, THYROIDAB in the last 72 hours. Anemia Panel: Recent Labs    11/14/19 1452  FERRITIN 1,068*   Urine analysis: No results found for: COLORURINE, APPEARANCEUR, LABSPEC, PHURINE, GLUCOSEU, HGBUR, BILIRUBINUR, KETONESUR, PROTEINUR, UROBILINOGEN, NITRITE, LEUKOCYTESUR Recent Results (from the past 240 hour(s))  Blood Culture (routine x 2)     Status: None (Preliminary result)   Collection Time: 11/14/19  3:19 PM   Specimen: BLOOD  Result Value Ref Range Status   Specimen Description BLOOD RIGHT ANTECUBITAL  Final   Special Requests   Final    BOTTLES DRAWN AEROBIC AND ANAEROBIC Blood Culture adequate volume   Culture   Final    NO GROWTH 2 DAYS Performed at  [facility], [address], [place], [place] [address]    Report Status PENDING  Incomplete  Blood Culture (routine x 2)     Status: None (Preliminary result)   Collection Time: 11/14/19  3:36 PM   Specimen: BLOOD LEFT WRIST  Result Value Ref Range Status   Specimen Description BLOOD LEFT WRIST  Final   Special Requests   Final    BOTTLES DRAWN AEROBIC AND ANAEROBIC Blood Culture adequate volume   Culture   Final    NO GROWTH 2 DAYS Performed at [facility], [place] [address], [place], [place] [address]    Report Status PENDING  Incomplete      Radiology Studies: Portable chest 1 View  Result Date: 11/14/2019 CLINICAL DATA:  64 year old male with history of shortness of breath and labored breathing. EXAM: PORTABLE CHEST 1 VIEW COMPARISON:  Chest x-ray 11/14/2019. FINDINGS: Low lung volumes. Patchy ill-defined opacities and areas of interstitial prominence throughout the mid to lower lungs bilaterally. No pleural effusions. No  pneumothorax. No evidence of pulmonary edema. Heart size is normal. Upper mediastinal contours are within normal limits. IMPRESSION: 1. Multilobar bilateral pneumonia with similar aeration compared to the prior study, as above. 2. Low lung volumes. Electronically Signed   By: Trudie Reed M.D.   On: 11/14/2019 19:53   DG Chest Portable 1 View  Result Date: 11/14/2019 CLINICAL DATA:  Fever and cough for 1 week EXAM: PORTABLE CHEST 1 VIEW COMPARISON:  None. FINDINGS: The heart size and mediastinal contours are within normal limits accounting for technique. Mild to moderate bilateral lower lung predominant interstitial and airspace opacities are seen. There is a mild left basilar atelectasis. There is no pleural effusion or pneumothorax. The visualized skeletal structures are unremarkable. IMPRESSION: Mild to moderate bilateral lower lung predominant interstitial and airspace opacities are nonspecific but may reflect COVID-19 pneumonia. Electronically Signed    By: Romona Curls M.D.   On: 11/14/2019 14:58    Scheduled Meds: . amLODipine  10 mg Oral Daily  . dexamethasone  6 mg Oral Q24H  . docusate sodium  100 mg Oral BID  . enoxaparin (LOVENOX) injection  40 mg Subcutaneous Q24H  . folic acid  1 mg Oral Daily  . hydrALAZINE  50 mg Oral Q8H  . multivitamin with minerals  1 tablet Oral Daily  . thiamine  100 mg Oral Daily   Continuous Infusions: . remdesivir 100 mg in NS 100 mL 100 mg (11/15/19 0855)     LOS: 2 days   Time spent: 35 minutes.  Tyrone Nine, MD [facility] [email or web address] 11/16/2019, 8:53 AM

## 2019-11-17 LAB — CBC WITH DIFFERENTIAL/PLATELET
Abs Immature Granulocytes: 0.68 10*3/uL — ABNORMAL HIGH (ref 0.00–0.07)
Basophils Absolute: 0.1 10*3/uL (ref 0.0–0.1)
Basophils Relative: 1 %
Eosinophils Absolute: 0 10*3/uL (ref 0.0–0.5)
Eosinophils Relative: 0 %
HCT: 39.6 % (ref 39.0–52.0)
Hemoglobin: 13.3 g/dL (ref 13.0–17.0)
Immature Granulocytes: 5 %
Lymphocytes Relative: 8 %
Lymphs Abs: 1.1 10*3/uL (ref 0.7–4.0)
MCH: 30.6 pg (ref 26.0–34.0)
MCHC: 33.6 g/dL (ref 30.0–36.0)
MCV: 91 fL (ref 80.0–100.0)
Monocytes Absolute: 1.4 10*3/uL — ABNORMAL HIGH (ref 0.1–1.0)
Monocytes Relative: 11 %
Neutro Abs: 9.7 10*3/uL — ABNORMAL HIGH (ref 1.7–7.7)
Neutrophils Relative %: 75 %
Platelets: 588 10*3/uL — ABNORMAL HIGH (ref 150–400)
RBC: 4.35 MIL/uL (ref 4.22–5.81)
RDW: 12.7 % (ref 11.5–15.5)
WBC: 12.9 10*3/uL — ABNORMAL HIGH (ref 4.0–10.5)
nRBC: 0 % (ref 0.0–0.2)

## 2019-11-17 LAB — COMPREHENSIVE METABOLIC PANEL
ALT: 33 U/L (ref 0–44)
AST: 30 U/L (ref 15–41)
Albumin: 2.3 g/dL — ABNORMAL LOW (ref 3.5–5.0)
Alkaline Phosphatase: 40 U/L (ref 38–126)
Anion gap: 10 (ref 5–15)
BUN: 24 mg/dL — ABNORMAL HIGH (ref 8–23)
CO2: 21 mmol/L — ABNORMAL LOW (ref 22–32)
Calcium: 9.3 mg/dL (ref 8.9–10.3)
Chloride: 103 mmol/L (ref 98–111)
Creatinine, Ser: 0.94 mg/dL (ref 0.61–1.24)
GFR calc Af Amer: >60 mL/min (ref >60)
GFR calc non Af Amer: >60 mL/min (ref >60)
Glucose, Bld: 120 mg/dL — ABNORMAL HIGH (ref 70–99)
Potassium: 4 mmol/L (ref 3.5–5.1)
Sodium: 134 mmol/L — ABNORMAL LOW (ref 135–145)
Total Bilirubin: 0.3 mg/dL (ref 0.3–1.2)
Total Protein: 6.7 g/dL (ref 6.5–8.1)

## 2019-11-17 LAB — GLUCOSE, CAPILLARY: Glucose-Capillary: 128 mg/dL — ABNORMAL HIGH (ref 70–99)

## 2019-11-17 LAB — C-REACTIVE PROTEIN: CRP: 4 mg/dL — ABNORMAL HIGH (ref <1.0)

## 2019-11-17 MED ORDER — ISOSORBIDE DINITRATE 10 MG PO TABS
10.0000 mg | ORAL_TABLET | Freq: Three times a day (TID) | ORAL | Status: DC
  Administered 2019-11-17 – 2019-11-18 (×4): 10 mg via ORAL
  Filled 2019-11-17 (×5): qty 1

## 2019-11-17 MED ORDER — HYDRALAZINE HCL 25 MG PO TABS
100.0000 mg | ORAL_TABLET | Freq: Three times a day (TID) | ORAL | Status: DC
  Administered 2019-11-17 – 2019-11-22 (×16): 100 mg via ORAL
  Filled 2019-11-17 (×15): qty 4

## 2019-11-17 MED ORDER — ISOSORBIDE DINITRATE 5 MG PO TABS: 5.0000 mg | ORAL_TABLET | Freq: Three times a day (TID) | ORAL | Status: DC

## 2019-11-17 NOTE — Progress Notes (Signed)
Tim updated

## 2019-11-17 NOTE — Progress Notes (Signed)
William Pruitt  DJT:[id number] DOB: 16-Apr-1956 DOA: 11/14/2019 PCP: Patient, No Pcp Per    Brief Narrative:  64 year old with a history of HTN who presented to the ED 2/21 with mild cough and diarrhea after a known exposure to Covid.  He was found to be hypoxemic and a CXR noted bilateral infiltrates.  His SARS-CoV-2 antigen test was positive.  Significant Events: 2/21 admit to [facility] via [facility] ED  COVID-19 specific Treatment: Remdesivir 2/21 > 2/25 Decadron 2/21 >  Antimicrobials:  None  Subjective: Presently saturating 96% on room air.  Sitting up in bed with no complaints.  States he feels much better overall.  Reports improving appetite.  Denies chest pain nausea vomiting or abdominal pain.  Assessment & Plan:  COVID Pneumonia - acute hypoxic respiratory failure Clinically improving at a rapid pace -will complete remdesivir course 2/25 -continue steroid for now -possible discharge in 24 hours  Recent Labs  Lab 11/14/19 1452 11/15/19 0235 11/16/19 0250 11/17/19 0315  DDIMER 1.90*  --   --   --   FERRITIN 1,068*  --   --   --   CRP 13.6* 14.4* 7.5* 4.0*  ALT 40 36 33 33  PROCALCITON 0.45  --   --   --     Uncontrolled HTN Blood pressure remains poorly controlled -adjust treatment and follow -ongoing care will be challenging given patient's homelessness - TOC to assist in establishing patient with a primary care physician -stop clonidine as patient will be at high risk for noncompliance and rebound hypertension would be worrisome -stop Norvasc as patient will not be able to afford this -increase hydralazine and add Isordil  Acute kidney injury Creatinine has normalized -peaked at 1.55 during this hospital stay -creatinine is now normalized  Hyponatremia Likely due to alcoholism -resolving in absence of alcohol and with volume resuscitation  Transaminitis Likely related to alcohol abuse-normalized  Alcohol abuse Last drink reportedly greater than 48 hours from  date of admission -no evidence of withdrawal at this time  DVT prophylaxis: Lovenox Code Status: FULL CODE Family Communication:  Disposition Plan: Medically the patient should be ready for discharge 2/25 but his homelessness will greatly complicate his disposition  Consultants:  none  Objective: Blood pressure (!) 162/99, pulse 86, temperature (!) 97.5 F (36.4 C), temperature source Oral, resp. rate 15, height 6\' 1"  (1.854 m), weight 88.5 kg, SpO2 96 %.  Intake/Output Summary (Last 24 hours) at 11/17/2019 1006 Last data filed at 11/17/2019 0600 Gross per 24 hour  Intake 480 ml  Output 1050 ml  Net -570 ml   Filed Weights   11/14/19 1637  Weight: 88.5 kg    Examination: General: No acute respiratory distress Lungs: Mild bibasilar crackles with no wheezing Cardiovascular: Regular rate and rhythm without murmur gallop or rub normal S1 and S2 Abdomen: Nontender, nondistended, soft, bowel sounds positive, no rebound, no ascites, no appreciable mass Extremities: No significant cyanosis, clubbing, or edema bilateral lower extremities  CBC: Recent Labs  Lab 11/15/19 0235 11/16/19 0250 11/17/19 0315  WBC 9.0 11.6* 12.9*  NEUTROABS 7.4 9.1* 9.7*  HGB 13.9 13.9 13.3  HCT 40.9 41.1 39.6  MCV 89.3 89.7 91.0  PLT 437* 528* 300*   Basic Metabolic Panel: Recent Labs  Lab 11/15/19 0235 11/16/19 0250 11/17/19 0315  NA 129* 131* 134*  K 3.5 3.5 4.0  CL 96* 100 103  CO2 21* 19* 21*  GLUCOSE 146* 143* 120*  BUN 30* 21 24*  CREATININE 1.22  1.07 0.94  CALCIUM 8.8* 9.3 9.3   GFR: Estimated Creatinine Clearance: 88.5 mL/min (by C-G formula based on SCr of 0.94 mg/dL).  Liver Function Tests: Recent Labs  Lab 11/14/19 1452 11/15/19 0235 11/16/19 0250 11/17/19 0315  AST 51* 41 33 30  ALT 40 36 33 33  ALKPHOS 38 41 40 40  BILITOT 0.8 0.5 0.5 0.3  PROT 7.3 7.4 7.3 6.7  ALBUMIN 2.3* 2.7* 2.4* 2.3*    CBG: Recent Labs  Lab 11/16/19 0720 11/16/19 1118 11/16/19 1523   GLUCAP 108* 124* 107*    Recent Results (from the past 240 hour(s))  Blood Culture (routine x 2)     Status: None (Preliminary result)   Collection Time: 11/14/19  3:19 PM   Specimen: BLOOD  Result Value Ref Range Status   Specimen Description BLOOD RIGHT ANTECUBITAL  Final   Special Requests   Final    BOTTLES DRAWN AEROBIC AND ANAEROBIC Blood Culture adequate volume   Culture   Final    NO GROWTH 2 DAYS Performed at [facility], [address], [place], [place] [address]    Report Status PENDING  Incomplete  Blood Culture (routine x 2)     Status: None (Preliminary result)   Collection Time: 11/14/19  3:36 PM   Specimen: BLOOD LEFT WRIST  Result Value Ref Range Status   Specimen Description BLOOD LEFT WRIST  Final   Special Requests   Final    BOTTLES DRAWN AEROBIC AND ANAEROBIC Blood Culture adequate volume   Culture   Final    NO GROWTH 2 DAYS Performed at [facility], [address] [place], [place] [address]    Report Status PENDING  Incomplete     Scheduled Meds: . amLODipine  10 mg Oral Daily  . cloNIDine  0.1 mg Oral TID  . dexamethasone  6 mg Oral Q24H  . docusate sodium  100 mg Oral BID  . enoxaparin (LOVENOX) injection  40 mg Subcutaneous Q24H  . folic acid  1 mg Oral Daily  . hydrALAZINE  50 mg Oral Q8H  . multivitamin with minerals  1 tablet Oral Daily  . thiamine  100 mg Oral Daily   Continuous Infusions: . remdesivir 100 mg in NS 100 mL 100 mg (11/16/19 0900)     LOS: 3 days   Lonia Blood, MD [facility] Office  [phone] Pager - Text Page per Loretha Stapler  If 7PM-7AM, please contact night-coverage per Amion 11/17/2019, 10:06 AM

## 2019-11-18 DIAGNOSIS — R0602 Shortness of breath: Secondary | ICD-10-CM

## 2019-11-18 LAB — CBC WITH DIFFERENTIAL/PLATELET
Abs Immature Granulocytes: 0.99 10*3/uL — ABNORMAL HIGH (ref 0.00–0.07)
Basophils Absolute: 0.1 10*3/uL (ref 0.0–0.1)
Basophils Relative: 1 %
Eosinophils Absolute: 0 10*3/uL (ref 0.0–0.5)
Eosinophils Relative: 0 %
HCT: 38.8 % — ABNORMAL LOW (ref 39.0–52.0)
Hemoglobin: 13.2 g/dL (ref 13.0–17.0)
Immature Granulocytes: 7 %
Lymphocytes Relative: 10 %
Lymphs Abs: 1.3 10*3/uL (ref 0.7–4.0)
MCH: 30.8 pg (ref 26.0–34.0)
MCHC: 34 g/dL (ref 30.0–36.0)
MCV: 90.7 fL (ref 80.0–100.0)
Monocytes Absolute: 0.9 10*3/uL (ref 0.1–1.0)
Monocytes Relative: 7 %
Neutro Abs: 10.1 10*3/uL — ABNORMAL HIGH (ref 1.7–7.7)
Neutrophils Relative %: 75 %
Platelets: 667 10*3/uL — ABNORMAL HIGH (ref 150–400)
RBC: 4.28 MIL/uL (ref 4.22–5.81)
RDW: 12.6 % (ref 11.5–15.5)
WBC: 13.4 10*3/uL — ABNORMAL HIGH (ref 4.0–10.5)
nRBC: 0 % (ref 0.0–0.2)

## 2019-11-18 LAB — COMPREHENSIVE METABOLIC PANEL
ALT: 48 U/L — ABNORMAL HIGH (ref 0–44)
AST: 51 U/L — ABNORMAL HIGH (ref 15–41)
Albumin: 2.5 g/dL — ABNORMAL LOW (ref 3.5–5.0)
Alkaline Phosphatase: 43 U/L (ref 38–126)
Anion gap: 12 (ref 5–15)
BUN: 21 mg/dL (ref 8–23)
CO2: 22 mmol/L (ref 22–32)
Calcium: 9.2 mg/dL (ref 8.9–10.3)
Chloride: 101 mmol/L (ref 98–111)
Creatinine, Ser: 1.05 mg/dL (ref 0.61–1.24)
GFR calc Af Amer: >60 mL/min (ref >60)
GFR calc non Af Amer: >60 mL/min (ref >60)
Glucose, Bld: 132 mg/dL — ABNORMAL HIGH (ref 70–99)
Potassium: 3.9 mmol/L (ref 3.5–5.1)
Sodium: 135 mmol/L (ref 135–145)
Total Bilirubin: 0.3 mg/dL (ref 0.3–1.2)
Total Protein: 7 g/dL (ref 6.5–8.1)

## 2019-11-18 LAB — FERRITIN: Ferritin: 591 ng/mL — ABNORMAL HIGH (ref 24–336)

## 2019-11-18 LAB — C-REACTIVE PROTEIN: CRP: 2.8 mg/dL — ABNORMAL HIGH (ref <1.0)

## 2019-11-18 LAB — D-DIMER, QUANTITATIVE: D-Dimer, Quant: 1.4 ug/mL-FEU — ABNORMAL HIGH (ref 0.00–0.50)

## 2019-11-18 MED ORDER — ISOSORBIDE DINITRATE 20 MG PO TABS
20.0000 mg | ORAL_TABLET | Freq: Three times a day (TID) | ORAL | Status: DC
  Administered 2019-11-18 – 2019-11-19 (×2): 20 mg via ORAL
  Filled 2019-11-18 (×3): qty 1

## 2019-11-18 NOTE — Progress Notes (Signed)
Occupational Therapy Evaluation Patient Details Name: William Pruitt MRN: [id number] DOB: 07/14/1956 Today's Date: 11/18/2019    History of Present Illness 64 y.o. male with a history of HTN who presented to the ED 2/21 with mild cough and diarrhea after an exposure to covid-positive resident from a boarding house. He appeared hypertensive without fever but was hypoxemic requiring 2L O2 in the ED. CXR showed infiltrates and SARS-CoV-2 antigen testing was positive.   Clinical Impression   Patient from group home and living situation for after discharge is unclear.  He cannot name exactly where he will be going, and he lacks family support.  Patient reluctantly agreeable to therapy today.  He was independent/mod I with all mobility and ADLs, occasionally using counter top to keep self steady with standing at sink.  He does displace some fatigue after 10 min stand and would benefit from OT to increase activity tolerance to baseline.   Patient remained on room air throughout session. Will continue to follow with OT acutely to address the deficits listed below.    Follow Up Recommendations  No OT follow up    Equipment Recommendations  None recommended by OT    Recommendations for Other Services       Precautions / Restrictions Precautions Precautions: None Restrictions Weight Bearing Restrictions: No      Mobility Bed Mobility Overal bed mobility: Independent                Transfers Overall transfer level: Independent Equipment used: None                  Balance Overall balance assessment: Modified Independent                                         ADL either performed or assessed with clinical judgement   ADL Overall ADL's : Independent;Modified independent                                     Functional mobility during ADLs: Independent General ADL Comments: Uses counter to lean against sometimes     Vision          Perception     Praxis      Pertinent Vitals/Pain Pain Assessment: No/denies pain     Hand Dominance     Extremity/Trunk Assessment Upper Extremity Assessment Upper Extremity Assessment: Overall WFL for tasks assessed   Lower Extremity Assessment Lower Extremity Assessment: Overall WFL for tasks assessed       Communication Communication Communication: No difficulties   Cognition Arousal/Alertness: Awake/alert Behavior During Therapy: WFL for tasks assessed/performed Overall Cognitive Status: Within Functional Limits for tasks assessed                                 General Comments: Little impulsive    General Comments       Exercises     Shoulder Instructions      Home Living Family/patient expects to be discharged to:: Other (Comment) Living Arrangements: Non-relatives/Friends                               Additional Comments: states he has no home currently needs to  find a new place to live at dc      Prior Functioning/Environment Level of Independence: Independent                 OT Problem List: Decreased activity tolerance;Decreased safety awareness;Cardiopulmonary status limiting activity      OT Treatment/Interventions: Self-care/ADL training;Therapeutic exercise;Energy conservation;Therapeutic activities;Patient/family education    OT Goals(Current goals can be found in the care plan section) Acute Rehab OT Goals Patient Stated Goal: to find a place to stay OT Goal Formulation: With patient Time For Goal Achievement: 12/02/19 Potential to Achieve Goals: Good  OT Frequency: Min 2X/week   Barriers to D/C: Decreased caregiver support  Patient from group home, stating he will not be returning there but cannot say where he will be going, other than that he is "going to find himself a place"       Co-evaluation              AM-PAC OT "6 Clicks" Daily Activity     Outcome Measure Help from another person  eating meals?: None Help from another person taking care of personal grooming?: None Help from another person toileting, which includes using toliet, bedpan, or urinal?: None Help from another person bathing (including washing, rinsing, drying)?: None Help from another person to put on and taking off regular upper body clothing?: None Help from another person to put on and taking off regular lower body clothing?: None 6 Click Score: 24   End of Session Nurse Communication: Mobility status  Activity Tolerance: Patient tolerated treatment well Patient left: in chair;with call bell/phone within reach  OT Visit Diagnosis: Other (comment)(Decreased activity tolerance)                Time: 3662-9476 OT Time Calculation (min): 29 min Charges:  OT General Charges $OT Visit: 1 Visit OT Evaluation $OT Eval Moderate Complexity: 1 Mod OT Treatments $Self Care/Home Management : 8-22 mins  August Luz, OTR/L   Phylliss Bob 11/18/2019, 12:47 PM

## 2019-11-18 NOTE — Progress Notes (Signed)
William Pruitt  [id number]:[id number] DOB: [birth year] DOA: 11/14/2019 PCP: Patient, No Pcp Per    Brief Narrative:  64 year old with a history of HTN who presented to the ED 2/21 with mild cough and diarrhea after a known exposure to Covid.  He was found to be hypoxemic and a CXR noted bilateral infiltrates.  His SARS-CoV-2 antigen test was positive.  Significant Events: 2/21 admit to [facility] via [facility] ED  COVID-19 specific Treatment: Remdesivir 2/21 > 2/25 Decadron 2/21 >  Antimicrobials:  None  Subjective: Completes his course of remdesivir today.  Saturations in the mid 90s on room air. No complaints. Ready for d/c when venue for safe d/c can be assured (pt currently homeless).   Assessment & Plan:  COVID Pneumonia - acute hypoxic respiratory failure Clinically improving at a rapid pace -will complete remdesivir course 2/25 - plan to discontinue steroid at time of d/c   Recent Labs  Lab 11/14/19 1452 11/15/19 0235 11/16/19 0250 11/17/19 0315 11/18/19 0251  DDIMER 1.90*  --   --   --  1.40*  FERRITIN 1,068*  --   --   --  591*  CRP 13.6* 14.4* 7.5* 4.0* 2.8*  ALT 40 36 33 33 48*  PROCALCITON 0.45  --   --   --   --     Uncontrolled HTN Blood pressure remains poorly controlled -adjust treatment and follow -ongoing care will be challenging given patient's homelessness - TOC to assist in establishing patient with a primary care physician - adjust medical tx   Acute kidney injury Creatinine has normalized -peaked at 1.55 during this hospital stay -creatinine is now normalized  Hyponatremia Likely due to alcoholism -resolving in absence of alcohol and with volume resuscitation  Transaminitis Likely related to alcohol abuse - recheck in AM   Alcohol abuse Last drink reportedly greater than 48 hours from date of admission -no evidence of withdrawal at this time  DVT prophylaxis: Lovenox Code Status: FULL CODE Family Communication:  Disposition Plan: Medically the  patient is ready for discharge but his homelessness will greatly complicate his disposition  Consultants:  none  Objective: Blood pressure (!) 166/107, pulse 80, temperature 98 F (36.7 C), temperature source Oral, resp. rate 18, height 6\' 1"  (1.854 m), weight 88.5 kg, SpO2 94 %.  Intake/Output Summary (Last 24 hours) at 11/18/2019 0911 Last data filed at 11/18/2019 0437 Gross per 24 hour  Intake 2058.58 ml  Output 2100 ml  Net -41.42 ml   Filed Weights   11/14/19 1637  Weight: 88.5 kg    Examination: General: No acute respiratory distress Lungs: CTA B - no wheezing  Cardiovascular: Regular rate and rhythm Abdomen: NT/ND, soft, bs+, no mass  Extremities: No signif edema bilateral lower extremities  CBC: Recent Labs  Lab 11/16/19 0250 11/17/19 0315 11/18/19 0251  WBC 11.6* 12.9* 13.4*  NEUTROABS 9.1* 9.7* 10.1*  HGB 13.9 13.3 13.2  HCT 41.1 39.6 38.8*  MCV 89.7 91.0 90.7  PLT 528* 588* 967*   Basic Metabolic Panel: Recent Labs  Lab 11/16/19 0250 11/17/19 0315 11/18/19 0251  NA 131* 134* 135  K 3.5 4.0 3.9  CL 100 103 101  CO2 19* 21* 22  GLUCOSE 143* 120* 132*  BUN 21 24* 21  CREATININE 1.07 0.94 1.05  CALCIUM 9.3 9.3 9.2   GFR: Estimated Creatinine Clearance: 79.3 mL/min (by C-G formula based on SCr of 1.05 mg/dL).  Liver Function Tests: Recent Labs  Lab 11/15/19 0235 11/16/19 0250 11/17/19  0315 11/18/19 0251  AST 41 33 30 51*  ALT 36 33 33 48*  ALKPHOS 41 40 40 43  BILITOT 0.5 0.5 0.3 0.3  PROT 7.4 7.3 6.7 7.0  ALBUMIN 2.7* 2.4* 2.3* 2.5*    CBG: Recent Labs  Lab 11/16/19 0720 11/16/19 1118 11/16/19 1523 11/17/19 1603  GLUCAP 108* 124* 107* 128*    Recent Results (from the past 240 hour(s))  Blood Culture (routine x 2)     Status: None (Preliminary result)   Collection Time: 11/14/19  3:19 PM   Specimen: BLOOD  Result Value Ref Range Status   Specimen Description BLOOD RIGHT ANTECUBITAL  Final   Special Requests   Final     BOTTLES DRAWN AEROBIC AND ANAEROBIC Blood Culture adequate volume   Culture   Final    NO GROWTH 3 DAYS Performed at [facility], [address], [place], [place] [address]    Report Status PENDING  Incomplete  Blood Culture (routine x 2)     Status: None (Preliminary result)   Collection Time: 11/14/19  3:36 PM   Specimen: BLOOD LEFT WRIST  Result Value Ref Range Status   Specimen Description BLOOD LEFT WRIST  Final   Special Requests   Final    BOTTLES DRAWN AEROBIC AND ANAEROBIC Blood Culture adequate volume   Culture   Final    NO GROWTH 3 DAYS Performed at [facility], [address], [place], [place] [address]    Report Status PENDING  Incomplete     Scheduled Meds: . docusate sodium  100 mg Oral BID  . enoxaparin (LOVENOX) injection  40 mg Subcutaneous Q24H  . folic acid  1 mg Oral Daily  . hydrALAZINE  100 mg Oral Q8H  . isosorbide dinitrate  10 mg Oral TID  . multivitamin with minerals  1 tablet Oral Daily  . thiamine  100 mg Oral Daily   Continuous Infusions: . remdesivir 100 mg in NS 100 mL Stopped (11/17/19 1050)     LOS: 4 days   Lonia Blood, MD [facility] Office  [phone] Pager - Text Page per Amion  If 7PM-7AM, please contact night-coverage per Amion 11/18/2019, 9:11 AM

## 2019-11-18 NOTE — Plan of Care (Signed)
  Problem: Education: Goal: Knowledge of risk factors and measures for prevention of condition will improve Outcome: Progressing   Problem: Coping: Goal: Psychosocial and spiritual needs will be supported Outcome: Progressing   Problem: Respiratory: Goal: Will maintain a patent airway Outcome: Progressing Goal: Complications related to the disease process, condition or treatment will be avoided or minimized Outcome: Progressing   

## 2019-11-18 NOTE — Progress Notes (Signed)
PHYSICAL THERAPY EVALUATION  HISTORY OF CURRENT ILLNESS: 64 y.o. male with a history of HTN who presented to the ED 2/21 with mild cough and diarrhea after an exposure to covid-positive resident from a boarding house. He appeared hypertensive without fever but was hypoxemic requiring 2L O2 in the ED. CXR showed infiltrates and SARS-CoV-2 antigen testing was positive. CRP 13.6, PCT 0.45. Remdesivir, steroids were given and the patient admitted to [facility] 2/21.   CLINICAL IMPRESSION: Pt admitted with above hx and above dx. He reports was very independent prior to admission denied owning or using any DME/AD. Currently he is independent with all functional mobility and is on room air, he was able to ambulate 260ft with no AD and mod I, sats remained in 99-100 range. Pt states that he currently has no home to dc to and is working with case worker for arrangements. At this time no further skilled acute care level PT is warranted pt is at mod I/I level. He has been encouraged to ambulate as much as able, to which he replied he does.  D/C RECOMMENDATION: Home with no further f/u/     11/18/19 1000  PT Visit Information  Last PT Received On 11/18/19  Assistance Needed +1  History of Present Illness v  Subjective Data  Patient Stated Goal to find a place to stay  Precautions  Precautions None  Restrictions  Weight Bearing Restrictions No  Pain Assessment  Pain Assessment No/denies pain  Cognition  Arousal/Alertness Awake/alert  Behavior During Therapy WFL for tasks assessed/performed  Overall Cognitive Status Within Functional Limits for tasks assessed  Bed Mobility  Overal bed mobility Independent  Transfers  Overall transfer level Independent  Ambulation/Gait  Ambulation/Gait assistance Modified independent (Device/Increase time)  Gait Distance (Feet) 200 Feet  Assistive device None  Gait Pattern/deviations WFL(Within Functional Limits)  Gait velocity interpretation >4.37 ft/sec, indicative  of normal walking speed  Balance  Overall balance assessment Modified Independent  PT - End of Session  Activity Tolerance Patient tolerated treatment well  Patient left in bed;with call bell/phone within reach  Nurse Communication Mobility status   PT - Assessment/Plan  Follow Up Recommendations No PT follow up  PT equipment None recommended by PT  AM-PAC PT "6 Clicks" Mobility Outcome Measure (Version 2)  Help needed turning from your back to your side while in a flat bed without using bedrails? 4  Help needed moving from lying on your back to sitting on the side of a flat bed without using bedrails? 4  Help needed moving to and from a bed to a chair (including a wheelchair)? 4  Help needed standing up from a chair using your arms (e.g., wheelchair or bedside chair)? 4  Help needed to walk in hospital room? 4  Help needed climbing 3-5 steps with a railing?  4  6 Click Score 24  Consider Recommendation of Discharge To: Home with no services  PT Time Calculation  PT Start Time (ACUTE ONLY) 1003  PT Stop Time (ACUTE ONLY) 1019  PT Time Calculation (min) (ACUTE ONLY) 16 min  PT General Charges  $$ ACUTE PT VISIT 1 Visit  PT Evaluation  $PT Eval Low Complexity 1 Low    Drema Pry, PT

## 2019-11-19 LAB — COMPREHENSIVE METABOLIC PANEL
ALT: 56 U/L — ABNORMAL HIGH (ref 0–44)
AST: 51 U/L — ABNORMAL HIGH (ref 15–41)
Albumin: 2.3 g/dL — ABNORMAL LOW (ref 3.5–5.0)
Alkaline Phosphatase: 45 U/L (ref 38–126)
Anion gap: 10 (ref 5–15)
BUN: 17 mg/dL (ref 8–23)
CO2: 24 mmol/L (ref 22–32)
Calcium: 8.9 mg/dL (ref 8.9–10.3)
Chloride: 102 mmol/L (ref 98–111)
Creatinine, Ser: 1.02 mg/dL (ref 0.61–1.24)
GFR calc Af Amer: >60 mL/min (ref >60)
GFR calc non Af Amer: >60 mL/min (ref >60)
Glucose, Bld: 75 mg/dL (ref 70–99)
Potassium: 3.7 mmol/L (ref 3.5–5.1)
Sodium: 136 mmol/L (ref 135–145)
Total Bilirubin: 0.3 mg/dL (ref 0.3–1.2)
Total Protein: 6.4 g/dL — ABNORMAL LOW (ref 6.5–8.1)

## 2019-11-19 LAB — CBC
HCT: 38.5 % — ABNORMAL LOW (ref 39.0–52.0)
Hemoglobin: 12.7 g/dL — ABNORMAL LOW (ref 13.0–17.0)
MCH: 30.2 pg (ref 26.0–34.0)
MCHC: 33 g/dL (ref 30.0–36.0)
MCV: 91.4 fL (ref 80.0–100.0)
Platelets: 698 10*3/uL — ABNORMAL HIGH (ref 150–400)
RBC: 4.21 MIL/uL — ABNORMAL LOW (ref 4.22–5.81)
RDW: 12.9 % (ref 11.5–15.5)
WBC: 14.2 10*3/uL — ABNORMAL HIGH (ref 4.0–10.5)
nRBC: 0 % (ref 0.0–0.2)

## 2019-11-19 MED ORDER — ISOSORBIDE DINITRATE 10 MG PO TABS
30.0000 mg | ORAL_TABLET | Freq: Three times a day (TID) | ORAL | Status: DC
  Administered 2019-11-19 – 2019-11-22 (×11): 30 mg via ORAL
  Filled 2019-11-19: qty 1
  Filled 2019-11-19: qty 3
  Filled 2019-11-19 (×3): qty 1
  Filled 2019-11-19: qty 3
  Filled 2019-11-19 (×5): qty 1
  Filled 2019-11-19: qty 3
  Filled 2019-11-19: qty 1

## 2019-11-19 MED ORDER — METOPROLOL TARTRATE 25 MG PO TABS
12.5000 mg | ORAL_TABLET | Freq: Two times a day (BID) | ORAL | Status: DC
  Administered 2019-11-19 – 2019-11-22 (×8): 12.5 mg via ORAL
  Filled 2019-11-19 (×8): qty 1

## 2019-11-19 NOTE — Progress Notes (Signed)
William Pruitt  [id number]:[id number] DOB: 1955/12/17 DOA: 11/14/2019 PCP: Patient, No Pcp Per    Brief Narrative:  64 year old with a history of HTN who presented to the ED 2/21 with mild cough and diarrhea after a known exposure to Covid.  He was found to be hypoxemic and a CXR noted bilateral infiltrates.  His SARS-CoV-2 antigen test was positive.  Significant Events: 2/21 admit to [facility] via [facility] ED  COVID-19 specific Treatment: Remdesivir 2/21 > 2/25 Decadron 2/21 > 2/26  Antimicrobials:  None  Subjective: Ready for d/c when venue for safe d/c can be assured (pt currently homeless).   Assessment & Plan:  COVID Pneumonia - acute hypoxic respiratory failure Has completed a course of remdesivir - discontinue steroid today as pt is no longer requiring O2 support and steroid may be worsening his BP control   Recent Labs  Lab 11/14/19 1452 11/14/19 1452 11/15/19 0235 11/16/19 0250 11/17/19 0315 11/18/19 0251 11/19/19 0209  DDIMER 1.90*  --   --   --   --  1.40*  --   FERRITIN 1,068*  --   --   --   --  591*  --   CRP 13.6*  --  14.4* 7.5* 4.0* 2.8*  --   ALT 40   < > 36 33 33 48* 56*  PROCALCITON 0.45  --   --   --   --   --   --    < > = values in this interval not displayed.    Uncontrolled HTN Blood pressure remains poorly controlled -adjust treatment again today and follow - ongoing care will be challenging given patient's homelessness - TOC to assist in establishing patient with a primary care physician which will be important for ongoing f/u of this issue   Acute kidney injury Creatinine has normalized -peaked at 1.55 during this hospital stay -creatinine is now normalized  Hyponatremia Likely due to alcoholism -resolving in absence of alcohol and with volume resuscitation  Transaminitis Likely related to alcohol abuse +/- Remdesivir +/- COVID - stable at this time - will need f/u in outpt setting   Alcohol abuse Last drink reportedly greater than 48 hours  from date of admission - no evidence of withdrawal   DVT prophylaxis: Lovenox Code Status: FULL CODE Family Communication:  Disposition Plan: Medically the patient is ready for discharge but his homelessness will greatly complicate his disposition  Consultants:  none  Objective: Blood pressure (!) 174/103, pulse (!) 101, temperature 98.2 F (36.8 C), temperature source Oral, resp. rate 20, height 6\' 1"  (1.854 m), weight 88.5 kg, SpO2 97 %.  Intake/Output Summary (Last 24 hours) at 11/19/2019 0926 Last data filed at 11/19/2019 0428 Gross per 24 hour  Intake 840 ml  Output 700 ml  Net 140 ml   Filed Weights   11/14/19 1637  Weight: 88.5 kg    Examination: General: No acute respiratory distress Lungs: CTA B - no wheezing  Cardiovascular: Regular rate and rhythm Abdomen: NT/ND, soft, bs+, no mass  Extremities: No signif edema bilateral lower extremities  CBC: Recent Labs  Lab 11/16/19 0250 11/16/19 0250 11/17/19 0315 11/18/19 0251 11/19/19 0209  WBC 11.6*   < > 12.9* 13.4* 14.2*  NEUTROABS 9.1*  --  9.7* 10.1*  --   HGB 13.9   < > 13.3 13.2 12.7*  HCT 41.1   < > 39.6 38.8* 38.5*  MCV 89.7   < > 91.0 90.7 91.4  PLT 528*   < >  588* 667* 698*   < > = values in this interval not displayed.   Basic Metabolic Panel: Recent Labs  Lab 11/17/19 0315 11/18/19 0251 11/19/19 0209  NA 134* 135 136  K 4.0 3.9 3.7  CL 103 101 102  CO2 21* 22 24  GLUCOSE 120* 132* 75  BUN 24* 21 17  CREATININE 0.94 1.05 1.02  CALCIUM 9.3 9.2 8.9   GFR: Estimated Creatinine Clearance: 81.6 mL/min (by C-G formula based on SCr of 1.02 mg/dL).  Liver Function Tests: Recent Labs  Lab 11/16/19 0250 11/17/19 0315 11/18/19 0251 11/19/19 0209  AST 33 30 51* 51*  ALT 33 33 48* 56*  ALKPHOS 40 40 43 45  BILITOT 0.5 0.3 0.3 0.3  PROT 7.3 6.7 7.0 6.4*  ALBUMIN 2.4* 2.3* 2.5* 2.3*    CBG: Recent Labs  Lab 11/16/19 0720 11/16/19 1118 11/16/19 1523 11/17/19 1603  GLUCAP 108* 124*  107* 128*    Recent Results (from the past 240 hour(s))  Blood Culture (routine x 2)     Status: None (Preliminary result)   Collection Time: 11/14/19  3:19 PM   Specimen: BLOOD  Result Value Ref Range Status   Specimen Description BLOOD RIGHT ANTECUBITAL  Final   Special Requests   Final    BOTTLES DRAWN AEROBIC AND ANAEROBIC Blood Culture adequate volume Performed at [facility], [address], [place], [place] [address]    Culture NO GROWTH 4 DAYS  Final   Report Status PENDING  Incomplete  Blood Culture (routine x 2)     Status: None (Preliminary result)   Collection Time: 11/14/19  3:36 PM   Specimen: BLOOD LEFT WRIST  Result Value Ref Range Status   Specimen Description BLOOD LEFT WRIST  Final   Special Requests   Final    BOTTLES DRAWN AEROBIC AND ANAEROBIC Blood Culture adequate volume   Culture NO GROWTH 4 DAYS  Final   Report Status PENDING  Incomplete     Scheduled Meds: . docusate sodium  100 mg Oral BID  . enoxaparin (LOVENOX) injection  40 mg Subcutaneous Q24H  . folic acid  1 mg Oral Daily  . hydrALAZINE  100 mg Oral Q8H  . isosorbide dinitrate  20 mg Oral TID  . multivitamin with minerals  1 tablet Oral Daily  . thiamine  100 mg Oral Daily      LOS: 5 days   Lonia Blood, MD [facility] Office  [phone] Pager - Text Page per Amion  If 7PM-7AM, please contact night-coverage per Amion 11/19/2019, 9:26 AM

## 2019-11-19 NOTE — Plan of Care (Signed)
  Problem: Education: Goal: Knowledge of risk factors and measures for prevention of condition will improve Outcome: Progressing   Problem: Coping: Goal: Psychosocial and spiritual needs will be supported Outcome: Progressing   Problem: Respiratory: Goal: Will maintain a patent airway Outcome: Progressing Goal: Complications related to the disease process, condition or treatment will be avoided or minimized Outcome: Progressing   

## 2019-11-20 NOTE — Progress Notes (Signed)
William Pruitt  [id number]:[id number] DOB: [birth year] DOA: 11/14/2019 PCP: Patient, No Pcp Per    Brief Narrative:  64 year old with a history of HTN who presented to the ED 2/21 with mild cough and diarrhea after a known exposure to Covid.  He was found to be hypoxemic and a CXR noted bilateral infiltrates.  His SARS-CoV-2 antigen test was positive.  Significant Events: 2/21 admit to [facility] via [facility] ED  COVID-19 specific Treatment: Remdesivir 2/21 > 2/25 Decadron 2/21 > 2/26  Antimicrobials:  None  Subjective: Ready for d/c when venue for safe d/c can be assured (pt currently homeless).   Assessment & Plan:  COVID Pneumonia - acute hypoxic respiratory failure Has completed a course of remdesivir - discontinued steroid 2/26 as pt was no longer requiring O2 support and steroid may have been worsening his BP control   Uncontrolled HTN Blood pressure remains poorly controlled but does appear to be improving -adjust treatment again today and follow - ongoing care will be challenging given patient's homelessness - TOC to assist in establishing patient with a primary care physician which will be important for ongoing f/u of this issue   Acute kidney injury Creatinine has normalized -peaked at 1.55 during this hospital stay   Hyponatremia Likely due to alcoholism -resolving in absence of alcohol and with volume resuscitation  Transaminitis Likely related to alcohol abuse +/- Remdesivir +/- COVID - stable at this time - will need f/u in outpt setting   Alcohol abuse Last drink reportedly greater than 48 hours from date of admission - no evidence of withdrawal   DVT prophylaxis: Lovenox Code Status: FULL CODE Family Communication:  Disposition Plan: Medically the patient is ready for discharge but his homelessness will greatly complicate his disposition  Consultants:  none  Objective: Blood pressure (!) 162/93, pulse 94, temperature 98.8 F (37.1 C), temperature source Oral,  resp. rate 20, height 6\' 1"  (1.854 m), weight 88.5 kg, SpO2 100 %.  Intake/Output Summary (Last 24 hours) at 11/20/2019 0959 Last data filed at 11/20/2019 0725 Gross per 24 hour  Intake 1100 ml  Output 2825 ml  Net -1725 ml   Filed Weights   11/14/19 1637  Weight: 88.5 kg    Examination: General: No acute respiratory distress Lungs: CTA B - no wheezing  Cardiovascular: Regular rate and rhythm Abdomen: NT/ND, soft, bs+, no mass  Extremities: No edema B LE   CBC: Recent Labs  Lab 11/16/19 0250 11/16/19 0250 11/17/19 0315 11/18/19 0251 11/19/19 0209  WBC 11.6*   < > 12.9* 13.4* 14.2*  NEUTROABS 9.1*  --  9.7* 10.1*  --   HGB 13.9   < > 13.3 13.2 12.7*  HCT 41.1   < > 39.6 38.8* 38.5*  MCV 89.7   < > 91.0 90.7 91.4  PLT 528*   < > 588* 667* 698*   < > = values in this interval not displayed.   Basic Metabolic Panel: Recent Labs  Lab 11/17/19 0315 11/18/19 0251 11/19/19 0209  NA 134* 135 136  K 4.0 3.9 3.7  CL 103 101 102  CO2 21* 22 24  GLUCOSE 120* 132* 75  BUN 24* 21 17  CREATININE 0.94 1.05 1.02  CALCIUM 9.3 9.2 8.9   GFR: Estimated Creatinine Clearance: 81.6 mL/min (by C-G formula based on SCr of 1.02 mg/dL).  Liver Function Tests: Recent Labs  Lab 11/16/19 0250 11/17/19 0315 11/18/19 0251 11/19/19 0209  AST 33 30 51* 51*  ALT 33 33  48* 56*  ALKPHOS 40 40 43 45  BILITOT 0.5 0.3 0.3 0.3  PROT 7.3 6.7 7.0 6.4*  ALBUMIN 2.4* 2.3* 2.5* 2.3*    CBG: Recent Labs  Lab 11/16/19 0720 11/16/19 1118 11/16/19 1523 11/17/19 1603  GLUCAP 108* 124* 107* 128*    Recent Results (from the past 240 hour(s))  Blood Culture (routine x 2)     Status: None (Preliminary result)   Collection Time: 11/14/19  3:19 PM   Specimen: BLOOD  Result Value Ref Range Status   Specimen Description BLOOD RIGHT ANTECUBITAL  Final   Special Requests   Final    BOTTLES DRAWN AEROBIC AND ANAEROBIC Blood Culture adequate volume Performed at [facility], [place]  [address], [place], [place] [address]    Culture NO GROWTH 4 DAYS  Final   Report Status PENDING  Incomplete  Blood Culture (routine x 2)     Status: None (Preliminary result)   Collection Time: 11/14/19  3:36 PM   Specimen: BLOOD LEFT WRIST  Result Value Ref Range Status   Specimen Description BLOOD LEFT WRIST  Final   Special Requests   Final    BOTTLES DRAWN AEROBIC AND ANAEROBIC Blood Culture adequate volume   Culture NO GROWTH 4 DAYS  Final   Report Status PENDING  Incomplete     Scheduled Meds: . docusate sodium  100 mg Oral BID  . enoxaparin (LOVENOX) injection  40 mg Subcutaneous Q24H  . folic acid  1 mg Oral Daily  . hydrALAZINE  100 mg Oral Q8H  . isosorbide dinitrate  30 mg Oral TID  . metoprolol tartrate  12.5 mg Oral BID  . multivitamin with minerals  1 tablet Oral Daily  . thiamine  100 mg Oral Daily      LOS: 6 days   Cherene Altes, MD [facility] Office  [phone] Pager - Text Page per Amion  If 7PM-7AM, please contact night-coverage per Amion 11/20/2019, 9:59 AM

## 2019-11-20 NOTE — Progress Notes (Signed)
Occupational Therapy Treatment Patient Details Name: William Pruitt MRN: [id number] DOB: 04/30/1956 Today's Date: 11/20/2019    History of present illness 64 y.o. male with a history of HTN who presented to the ED 2/21 with mild cough and diarrhea after an exposure to covid-positive resident from a boarding house. He appeared hypertensive without fever but was hypoxemic requiring 2L O2 in the ED. CXR showed infiltrates and SARS-CoV-2 antigen testing was positive.   OT comments  Patient sitting up in bed on arrival.  He was quite reluctant to participate in therapy.  Thought the idea of possibly needing assistance was "ridiculous".  Patient eventually agreed to walk in the hall to improve activity tolerance.  Walked with supervision 249f.  Upon return patient ignoring therapist.  Will d/c from therapy as patient performing ADLs independently and not wanting to participate in therapy.   Follow Up Recommendations  No OT follow up    Equipment Recommendations  None recommended by OT    Recommendations for Other Services      Precautions / Restrictions Precautions Precautions: None       Mobility Bed Mobility Overal bed mobility: Independent                Transfers Overall transfer level: Independent                    Balance Overall balance assessment: Modified Independent                                         ADL either performed or assessed with clinical judgement   ADL Overall ADL's : Independent;Modified independent                                       General ADL Comments: Patient maintains balance when walking but is slightly unsteady     Vision       Perception     Praxis      Cognition Arousal/Alertness: Awake/alert Behavior During Therapy: WFL for tasks assessed/performed;Agitated Overall Cognitive Status: Within Functional Limits for tasks assessed                                  General Comments: Little impulsive, does not like therapy and does not want to participate        Exercises Exercises: Other exercises Other Exercises Other Exercises: Walk 2017f  Shoulder Instructions       General Comments      Pertinent Vitals/ Pain       Pain Assessment: No/denies pain  Home Living                                          Prior Functioning/Environment              Frequency           Progress Toward Goals  OT Goals(current goals can now be found in the care plan section)  Progress towards OT goals: Goals met/education completed, patient discharged from OT  Acute Rehab OT Goals Patient Stated Goal: to find a place to stay OT Goal Formulation: With  patient Time For Goal Achievement: 12/02/19 Potential to Achieve Goals: Good  Plan Discharge plan remains appropriate;All goals met and education completed, patient discharged from OT services    Co-evaluation                 AM-PAC OT "6 Clicks" Daily Activity     Outcome Measure   Help from another person eating meals?: None Help from another person taking care of personal grooming?: None Help from another person toileting, which includes using toliet, bedpan, or urinal?: None Help from another person bathing (including washing, rinsing, drying)?: None Help from another person to put on and taking off regular upper body clothing?: None Help from another person to put on and taking off regular lower body clothing?: None 6 Click Score: 24    End of Session    OT Visit Diagnosis: Other (comment)   Activity Tolerance Patient tolerated treatment well   Patient Left in bed;with call bell/phone within reach   Nurse Communication Mobility status        Time: 1055-1105 OT Time Calculation (min): 10 min  Charges: OT General Charges $OT Visit: 1 Visit OT Treatments $Therapeutic Activity: 8-22 mins  August Luz, OTR/L    Phylliss Bob 11/20/2019, 12:08 PM

## 2019-11-21 LAB — COMPREHENSIVE METABOLIC PANEL
ALT: 49 U/L — ABNORMAL HIGH (ref 0–44)
AST: 37 U/L (ref 15–41)
Albumin: 2.7 g/dL — ABNORMAL LOW (ref 3.5–5.0)
Alkaline Phosphatase: 54 U/L (ref 38–126)
Anion gap: 12 (ref 5–15)
BUN: 13 mg/dL (ref 8–23)
CO2: 23 mmol/L (ref 22–32)
Calcium: 9.4 mg/dL (ref 8.9–10.3)
Chloride: 103 mmol/L (ref 98–111)
Creatinine, Ser: 0.94 mg/dL (ref 0.61–1.24)
GFR calc Af Amer: >60 mL/min (ref >60)
GFR calc non Af Amer: >60 mL/min (ref >60)
Glucose, Bld: 86 mg/dL (ref 70–99)
Potassium: 4.4 mmol/L (ref 3.5–5.1)
Sodium: 138 mmol/L (ref 135–145)
Total Bilirubin: 0.4 mg/dL (ref 0.3–1.2)
Total Protein: 7 g/dL (ref 6.5–8.1)

## 2019-11-21 MED ORDER — ISOSORBIDE DINITRATE 30 MG PO TABS: 30.0000 mg | ORAL_TABLET | Freq: Three times a day (TID) | ORAL | 1 refills | Status: DC

## 2019-11-21 MED ORDER — ACETAMINOPHEN 325 MG PO TABS: 650.0000 mg | ORAL_TABLET | Freq: Four times a day (QID) | ORAL | 0 refills | Status: AC | PRN

## 2019-11-21 MED ORDER — METOPROLOL TARTRATE 25 MG PO TABS: 12.5000 mg | ORAL_TABLET | Freq: Two times a day (BID) | ORAL | 1 refills | Status: DC

## 2019-11-21 MED ORDER — HYDRALAZINE HCL 100 MG PO TABS: 100.0000 mg | ORAL_TABLET | Freq: Three times a day (TID) | ORAL | 1 refills | Status: DC

## 2019-11-21 NOTE — Progress Notes (Signed)
Plan to discharge patient tomorrow once hotel voucher is obtain from Child psychotherapist. Patient educated on quarantine / social distance after discharge. Patient on room air stating 95%-98% no signs distress noted. Vitals stable. All needs address. Will continue to monitor patient. Call light within reach.

## 2019-11-21 NOTE — Discharge Instructions (Signed)
Date of Positive COVID Test: 11/14/19  Date Quarantine Ends: 12/05/19    COVID-19 COVID-19 is a respiratory infection that is caused by a virus called severe acute respiratory syndrome coronavirus 2 (SARS-CoV-2). The disease is also known as coronavirus disease or novel coronavirus. In some people, the virus may not cause any symptoms. In others, it may cause a serious infection. The infection can get worse quickly and can lead to complications, such as:  Pneumonia, or infection of the lungs.  Acute respiratory distress syndrome or ARDS. This is a condition in which fluid build-up in the lungs prevents the lungs from filling with air and passing oxygen into the blood.  Acute respiratory failure. This is a condition in which there is not enough oxygen passing from the lungs to the body or when carbon dioxide is not passing from the lungs out of the body.  Sepsis or septic shock. This is a serious bodily reaction to an infection.  Blood clotting problems.  Secondary infections due to bacteria or fungus.  Organ failure. This is when your body's organs stop working. The virus that causes COVID-19 is contagious. This means that it can spread from person to person through droplets from coughs and sneezes (respiratory secretions). What are the causes? This illness is caused by a virus. You may catch the virus by:  Breathing in droplets from an infected person. Droplets can be spread by a person breathing, speaking, singing, coughing, or sneezing.  Touching something, like a table or a doorknob, that was exposed to the virus (contaminated) and then touching your mouth, nose, or eyes. What increases the risk? Risk for infection You are more likely to be infected with this virus if you:  Are within 6 feet (2 meters) of a person with COVID-19.  Provide care for or live with a person who is infected with COVID-19.  Spend time in crowded indoor spaces or live in shared housing. Risk for  serious illness You are more likely to become seriously ill from the virus if you:  Are 48 years of age or older. The higher your age, the more you are at risk for serious illness.  Live in a nursing home or long-term care facility.  Have cancer.  Have a long-term (chronic) disease such as: ? Chronic lung disease, including chronic obstructive pulmonary disease or asthma. ? A long-term disease that lowers your body's ability to fight infection (immunocompromised). ? Heart disease, including heart failure, a condition in which the arteries that lead to the heart become narrow or blocked (coronary artery disease), a disease which makes the heart muscle thick, weak, or stiff (cardiomyopathy). ? Diabetes. ? Chronic kidney disease. ? Sickle cell disease, a condition in which red blood cells have an abnormal "sickle" shape. ? Liver disease.  Are obese. What are the signs or symptoms? Symptoms of this condition can range from mild to severe. Symptoms may appear any time from 2 to 14 days after being exposed to the virus. They include:  A fever or chills.  A cough.  Difficulty breathing.  Headaches, body aches, or muscle aches.  Runny or stuffy (congested) nose.  A sore throat.  New loss of taste or smell. Some people may also have stomach problems, such as nausea, vomiting, or diarrhea. Other people may not have any symptoms of COVID-19. How is this diagnosed? This condition may be diagnosed based on:  Your signs and symptoms, especially if: ? You live in an area with a COVID-19 outbreak. ? You  recently traveled to or from an area where the virus is common. ? You provide care for or live with a person who was diagnosed with COVID-19. ? You were exposed to a person who was diagnosed with COVID-19.  A physical exam.  Lab tests, which may include: ? Taking a sample of fluid from the back of your nose and throat (nasopharyngeal fluid), your nose, or your throat using a  swab. ? A sample of mucus from your lungs (sputum). ? Blood tests.  Imaging tests, which may include, X-rays, CT scan, or ultrasound. How is this treated? At present, there is no medicine to treat COVID-19. Medicines that treat other diseases are being used on a trial basis to see if they are effective against COVID-19. Your health care provider will talk with you about ways to treat your symptoms. For most people, the infection is mild and can be managed at home with rest, fluids, and over-the-counter medicines. Treatment for a serious infection usually takes places in a hospital intensive care unit (ICU). It may include one or more of the following treatments. These treatments are given until your symptoms improve.  Receiving fluids and medicines through an IV.  Supplemental oxygen. Extra oxygen is given through a tube in the nose, a face mask, or a hood.  Positioning you to lie on your stomach (prone position). This makes it easier for oxygen to get into the lungs.  Continuous positive airway pressure (CPAP) or bi-level positive airway pressure (BPAP) machine. This treatment uses mild air pressure to keep the airways open. A tube that is connected to a motor delivers oxygen to the body.  Ventilator. This treatment moves air into and out of the lungs by using a tube that is placed in your windpipe.  Tracheostomy. This is a procedure to create a hole in the neck so that a breathing tube can be inserted.  Extracorporeal membrane oxygenation (ECMO). This procedure gives the lungs a chance to recover by taking over the functions of the heart and lungs. It supplies oxygen to the body and removes carbon dioxide. Follow these instructions at home: Lifestyle  If you are sick, stay home except to get medical care. Your health care provider will tell you how long to stay home. Call your health care provider before you go for medical care.  Rest at home as told by your health care provider.  Do  not use any products that contain nicotine or tobacco, such as cigarettes, e-cigarettes, and chewing tobacco. If you need help quitting, ask your health care provider.  Return to your normal activities as told by your health care provider. Ask your health care provider what activities are safe for you. General instructions  Take over-the-counter and prescription medicines only as told by your health care provider.  Drink enough fluid to keep your urine pale yellow.  Keep all follow-up visits as told by your health care provider. This is important. How is this prevented?  There is no vaccine to help prevent COVID-19 infection. However, there are steps you can take to protect yourself and others from this virus. To protect yourself:   Do not travel to areas where COVID-19 is a risk. The areas where COVID-19 is reported change often. To identify high-risk areas and travel restrictions, check the CDC travel website: FatFares.com.br  If you live in, or must travel to, an area where COVID-19 is a risk, take precautions to avoid infection. ? Stay away from people who are sick. ? Wash  your hands often with soap and water for 20 seconds. If soap and water are not available, use an alcohol-based hand sanitizer. ? Avoid touching your mouth, face, eyes, or nose. ? Avoid going out in public, follow guidance from your state and local health authorities. ? If you must go out in public, wear a cloth face covering or face mask. Make sure your mask covers your nose and mouth. ? Avoid crowded indoor spaces. Stay at least 6 feet (2 meters) away from others. ? Disinfect objects and surfaces that are frequently touched every day. This may include:  Counters and tables.  Doorknobs and light switches.  Sinks and faucets.  Electronics, such as phones, remote controls, keyboards, computers, and tablets. To protect others: If you have symptoms of COVID-19, take steps to prevent the virus from  spreading to others.  If you think you have a COVID-19 infection, contact your health care provider right away. Tell your health care team that you think you may have a COVID-19 infection.  Stay home. Leave your house only to seek medical care. Do not use public transport.  Do not travel while you are sick.  Wash your hands often with soap and water for 20 seconds. If soap and water are not available, use alcohol-based hand sanitizer.  Stay away from other members of your household. Let healthy household members care for children and pets, if possible. If you have to care for children or pets, wash your hands often and wear a mask. If possible, stay in your own room, separate from others. Use a different bathroom.  Make sure that all people in your household wash their hands well and often.  Cough or sneeze into a tissue or your sleeve or elbow. Do not cough or sneeze into your hand or into the air.  Wear a cloth face covering or face mask. Make sure your mask covers your nose and mouth. Where to find more information  Centers for Disease Control and Prevention: PurpleGadgets.be  World Health Organization: https://www.castaneda.info/ Contact a health care provider if:  You live in or have traveled to an area where COVID-19 is a risk and you have symptoms of the infection.  You have had contact with someone who has COVID-19 and you have symptoms of the infection. Get help right away if:  You have trouble breathing.  You have pain or pressure in your chest.  You have confusion.  You have bluish lips and fingernails.  You have difficulty waking from sleep.  You have symptoms that get worse. These symptoms may represent a serious problem that is an emergency. Do not wait to see if the symptoms will go away. Get medical help right away. Call your local emergency services (911 in the [place] Do not drive yourself to the hospital. Let the  emergency medical personnel know if you think you have COVID-19. Summary  COVID-19 is a respiratory infection that is caused by a virus. It is also known as coronavirus disease or novel coronavirus. It can cause serious infections, such as pneumonia, acute respiratory distress syndrome, acute respiratory failure, or sepsis.  The virus that causes COVID-19 is contagious. This means that it can spread from person to person through droplets from breathing, speaking, singing, coughing, or sneezing.  You are more likely to develop a serious illness if you are 25 years of age or older, have a weak immune system, live in a nursing home, or have chronic disease.  There is no medicine to treat COVID-19.  Your health care provider will talk with you about ways to treat your symptoms.  Take steps to protect yourself and others from infection. Wash your hands often and disinfect objects and surfaces that are frequently touched every day. Stay away from people who are sick and wear a mask if you are sick. This information is not intended to replace advice given to you by your health care provider. Make sure you discuss any questions you have with your health care provider. Document Revised: 07/09/2019 Document Reviewed: 10/15/2018 Elsevier Patient Education  2020 [employer]   COVID-19 Frequently Asked Questions COVID-19 (coronavirus disease) is an infection that is caused by a large family of viruses. Some viruses cause illness in people and others cause illness in animals like camels, cats, and bats. In some cases, the viruses that cause illness in animals can spread to humans. Where did the coronavirus come from? In December 2019, Armeniahina told the [employer] [facility](WHO) of several cases of lung disease (human respiratory illness). These cases were linked to an open seafood and livestock market in the city [place]. The link to the seafood and livestock market suggests that the virus may have  spread from animals to humans. However, since that first outbreak in December, the virus has also been shown to spread from person to person. What is the name of the disease and the virus? Disease name Early on, this disease was called novel coronavirus. This is because scientists determined that the disease was caused by a new (novel) respiratory virus. The World Health Organization [facility](WHO) has now named the disease COVID-19, or coronavirus disease. Virus name The virus that causes the disease is called severe acute respiratory syndrome coronavirus 2 (SARS-CoV-2). More information on disease and virus naming World Health Organization [facility](WHO): www.who.int/emergencies/diseases/novel-coronavirus-2019/technical-guidance/naming-the-coronavirus-disease-(covid-2019)-and-the-virus-that-causes-it Who is at risk for complications from coronavirus disease? Some people may be at higher risk for complications from coronavirus disease. This includes older adults and people who have chronic diseases, such as heart disease, diabetes, and lung disease. If you are at higher risk for complications, take these extra precautions:  Stay home as much as possible.  Avoid social gatherings and travel.  Avoid close contact with others. Stay at least 6 ft (2 m) away from others, if possible.  Wash your hands often with soap and water for at least 20 seconds.  Avoid touching your face, mouth, nose, or eyes.  Keep supplies on hand at home, such as food, medicine, and cleaning supplies.  If you must go out in public, wear a cloth face covering or face mask. Make sure your mask covers your nose and mouth. How does coronavirus disease spread? The virus that causes coronavirus disease spreads easily from person to person (is contagious). You may catch the virus by:  Breathing in droplets from an infected person. Droplets can be spread by a person breathing, speaking, singing, coughing, or sneezing.  Touching something,  like a table or a doorknob, that was exposed to the virus (contaminated) and then touching your mouth, nose, or eyes. Can I get the virus from touching surfaces or objects? There is still a lot that we do not know about the virus that causes coronavirus disease. Scientists are basing a lot of information on what they know about similar viruses, such as:  Viruses cannot generally survive on surfaces for long. They need a human body (host) to survive.  It is more likely that the virus is spread by close contact with people who are sick (  direct contact), such as through: ? Shaking hands or hugging. ? Breathing in respiratory droplets that travel through the air. Droplets can be spread by a person breathing, speaking, singing, coughing, or sneezing.  It is less likely that the virus is spread when a person touches a surface or object that has the virus on it (indirect contact). The virus may be able to enter the body if the person touches a surface or object and then touches his or her face, eyes, nose, or mouth. Can a person spread the virus without having symptoms of the disease? It may be possible for the virus to spread before a person has symptoms of the disease, but this is most likely not the main way the virus is spreading. It is more likely for the virus to spread by being in close contact with people who are sick and breathing in the respiratory droplets spread by a person breathing, speaking, singing, coughing, or sneezing. What are the symptoms of coronavirus disease? Symptoms vary from person to person and can range from mild to severe. Symptoms may include:  Fever or chills.  Cough.  Difficulty breathing or feeling short of breath.  Headaches, body aches, or muscle aches.  Runny or stuffy (congested) nose.  Sore throat.  New loss of taste or smell.  Nausea, vomiting, or diarrhea. These symptoms can appear anywhere from 2 to 14 days after you have been exposed to the virus.  Some people may not have any symptoms. If you develop symptoms, call your health care provider. People with severe symptoms may need hospital care. Should I be tested for this virus? Your health care provider will decide whether to test you based on your symptoms, history of exposure, and your risk factors. How does a health care provider test for this virus? Health care providers will collect samples to send for testing. Samples may include:  Taking a swab of fluid from the back of your nose and throat, your nose, or your throat.  Taking fluid from the lungs by having you cough up mucus (sputum) into a sterile cup.  Taking a blood sample. Is there a treatment or vaccine for this virus? Currently, there is no vaccine to prevent coronavirus disease. Also, there are no medicines like antibiotics or antivirals to treat the virus. A person who becomes sick is given supportive care, which means rest and fluids. A person may also relieve his or her symptoms by using over-the-counter medicines that treat sneezing, coughing, and runny nose. These are the same medicines that a person takes for the common cold. If you develop symptoms, call your health care provider. People with severe symptoms may need hospital care. What can I do to protect myself and my family from this virus?     You can protect yourself and your family by taking the same actions that you would take to prevent the spread of other viruses. Take the following actions:  Wash your hands often with soap and water for at least 20 seconds. If soap and water are not available, use alcohol-based hand sanitizer.  Avoid touching your face, mouth, nose, or eyes.  Cough or sneeze into a tissue, sleeve, or elbow. Do not cough or sneeze into your hand or the air. ? If you cough or sneeze into a tissue, throw it away immediately and wash your hands.  Disinfect objects and surfaces that you frequently touch every day.  Stay away from people  who are sick.  Avoid going out in  public, follow guidance from your state and local health authorities.  Avoid crowded indoor spaces. Stay at least 6 ft (2 m) away from others.  If you must go out in public, wear a cloth face covering or face mask. Make sure your mask covers your nose and mouth.  Stay home if you are sick, except to get medical care. Call your health care provider before you get medical care. Your health care provider will tell you how long to stay home.  Make sure your vaccines are up to date. Ask your health care provider what vaccines you need. What should I do if I need to travel? Follow travel recommendations from your local health authority, the CDC, and WHO. Travel information and advice  Centers for Disease Control and Prevention (CDC): BodyEditor.hu  World Health Organization [facility]): ThirdIncome.ca Know the risks and take action to protect your health  You are at higher risk of getting coronavirus disease if you are traveling to areas with an outbreak or if you are exposed to travelers from areas with an outbreak.  Wash your hands often and practice good hygiene to lower the risk of catching or spreading the virus. What should I do if I am sick? General instructions to stop the spread of infection  Wash your hands often with soap and water for at least 20 seconds. If soap and water are not available, use alcohol-based hand sanitizer.  Cough or sneeze into a tissue, sleeve, or elbow. Do not cough or sneeze into your hand or the air.  If you cough or sneeze into a tissue, throw it away immediately and wash your hands.  Stay home unless you must get medical care. Call your health care provider or local health authority before you get medical care.  Avoid public areas. Do not take public transportation, if possible.  If you can, wear a mask if you must go out of the  house or if you are in close contact with someone who is not sick. Make sure your mask covers your nose and mouth. Keep your home clean  Disinfect objects and surfaces that are frequently touched every day. This may include: ? Counters and tables. ? Doorknobs and light switches. ? Sinks and faucets. ? Electronics such as phones, remote controls, keyboards, computers, and tablets.  Wash dishes in hot, soapy water or use a dishwasher. Air-dry your dishes.  Wash laundry in hot water. Prevent infecting other household members  Let healthy household members care for children and pets, if possible. If you have to care for children or pets, wash your hands often and wear a mask.  Sleep in a different bedroom or bed, if possible.  Do not share personal items, such as razors, toothbrushes, deodorant, combs, brushes, towels, and washcloths. Where to find more information Centers for Disease Control and Prevention (CDC)  Information and news updates: https://www.butler-gonzalez.com/ World Health Organization [facility])  Information and news updates: MissExecutive.com.ee  Coronavirus health topic: https://www.castaneda.info/  Questions and answers on COVID-19: OpportunityDebt.at  Global tracker: who.sprinklr.com American Academy of Pediatrics (AAP)  Information for families: www.healthychildren.org/English/health-issues/conditions/chest-lungs/Pages/2019-Novel-Coronavirus.aspx The coronavirus situation is changing rapidly. Check your local health authority website or the CDC and [facility] websites for updates and news. When should I contact a health care provider?  Contact your health care provider if you have symptoms of an infection, such as fever or cough, and you: ? Have been near anyone who is known to have coronavirus disease. ? Have come into contact with a person who  is suspected to have coronavirus disease. ? Have  traveled to an area where there is an outbreak of COVID-19. When should I get emergency medical care?  Get help right away by calling your local emergency services (911 in the [place] if you have: ? Trouble breathing. ? Pain or pressure in your chest. ? Confusion. ? Blue-tinged lips and fingernails. ? Difficulty waking from sleep. ? Symptoms that get worse. Let the emergency medical personnel know if you think you have coronavirus disease. Summary  A new respiratory virus is spreading from person to person and causing COVID-19 (coronavirus disease).  The virus that causes COVID-19 appears to spread easily. It spreads from one person to another through droplets from breathing, speaking, singing, coughing, or sneezing.  Older adults and those with chronic diseases are at higher risk of disease. If you are at higher risk for complications, take extra precautions.  There is currently no vaccine to prevent coronavirus disease. There are no medicines, such as antibiotics or antivirals, to treat the virus.  You can protect yourself and your family by washing your hands often, avoiding touching your face, and covering your coughs and sneezes. This information is not intended to replace advice given to you by your health care provider. Make sure you discuss any questions you have with your health care provider. Document Revised: 07/09/2019 Document Reviewed: 01/05/2019 Elsevier Patient Education  2020 Elsevier Inc.  COVID-19: How to Protect Yourself and Others Know how it spreads  There is currently no vaccine to prevent coronavirus disease 2019 (COVID-19).  The best way to prevent illness is to avoid being exposed to this virus.  The virus is thought to spread mainly from person-to-person. ? Between people who are in close contact with one another (within about 6 feet). ? Through respiratory droplets produced when an infected person coughs, sneezes or talks. ? These droplets can land in  the mouths or noses of people who are nearby or possibly be inhaled into the lungs. ? COVID-19 may be spread by people who are not showing symptoms. Everyone should Clean your hands often  Wash your hands often with soap and water for at least 20 seconds especially after you have been in a public place, or after blowing your nose, coughing, or sneezing.  If soap and water are not readily available, use a hand sanitizer that contains at least 60% alcohol. Cover all surfaces of your hands and rub them together until they feel dry.  Avoid touching your eyes, nose, and mouth with unwashed hands. Avoid close contact  Limit contact with others as much as possible.  Avoid close contact with people who are sick.  Put distance between yourself and other people. ? Remember that some people without symptoms may be able to spread virus. ? This is especially important for people who are at higher risk of getting very RetroStamps.it Cover your mouth and nose with a mask when around others  You could spread COVID-19 to others even if you do not feel sick.  Everyone should wear a mask in public settings and when around people not living in their household, especially when social distancing is difficult to maintain. ? Masks should not be placed on young children under age 20, anyone who has trouble breathing, or is unconscious, incapacitated or otherwise unable to remove the mask without assistance.  The mask is meant to protect other people in case you are infected.  Do NOT use a facemask meant for a Research scientist (physical sciences).  Continue  to keep about 6 feet between yourself and others. The mask is not a substitute for social distancing. Cover coughs and sneezes  Always cover your mouth and nose with a tissue when you cough or sneeze or use the inside of your elbow.  Throw used tissues in the trash.  Immediately wash your hands with  soap and water for at least 20 seconds. If soap and water are not readily available, clean your hands with a hand sanitizer that contains at least 60% alcohol. Clean and disinfect  Clean AND disinfect frequently touched surfaces daily. This includes tables, doorknobs, light switches, countertops, handles, desks, phones, keyboards, toilets, faucets, and sinks. ktimeonline.com  If surfaces are dirty, clean them: Use detergent or soap and water prior to disinfection.  Then, use a household disinfectant. You can see a list of EPA-registered household disinfectants here. [employer].uk 05/26/2019 This information is not intended to replace advice given to you by your health care provider. Make sure you discuss any questions you have with your health care provider. Document Revised: 06/03/2019 Document Reviewed: 04/01/2019 Elsevier Patient Education  2020 [employer].

## 2019-11-21 NOTE — Discharge Summary (Signed)
DISCHARGE SUMMARY  William Pruitt  MR#: [id number]  DOB:Nov 05, 1955  Date of Admission: 11/14/2019 Date of Discharge: 11/22/2019  Attending Physician:Maura Braaten Hennie Duos, MD  Patient's POE:UMPNTIR, No Pcp Per  Consults: none   Disposition: D/C home (arranged hotel room for quarantine)  Date of Positive COVID Test: 11/14/19  Date Quarantine Ends: 12/05/19  COVID-19 specific Treatment: Remdesivir 2/21 > 2/25 Decadron 2/21 > 2/26  Follow-up Appts: TOC to assist in establishing patient with a primary care physician prior to D/C home.  Tests Needing Follow-up: -assess BP control   Discharge Diagnoses: COVID Pneumonia Acute hypoxic respiratory failure Uncontrolled HTN Acute kidney injury Hyponatremia Transaminitis Alcohol abuse  Initial presentation: 64 year old with a history of HTN who presented to the ED 2/21 with mild cough and diarrhea after a known exposure to Covid.  He was found to be hypoxemic and a CXR noted bilateral infiltrates.  His SARS-CoV-2 antigen test was positive.  Hospital Course:  COVID Pneumonia - acute hypoxic respiratory failure completed a course of remdesivir - discontinued steroid 2/26 as pt was no longer requiring O2 support and steroid may have been worsening his BP control - asymptomatic in regard to COVID at time of d/c   Uncontrolled HTN Blood pressure proved difficult to control during his hospitalization - care was complicated by the need to focus on readily obtainable meds given his lack of insurance and homelessness - BP meds titrated multiple times th/o admit, with significant overall improvement in BP accomplished - TOC to assist in establishing patient with a primary care physician which will be important for ongoing f/u of this issue   Acute kidney injury Creatinine normalized prior to d/c - peaked at 1.55 during this hospital stay   Hyponatremia Likely due to alcoholism - resolved in absence of alcohol and with volume  resuscitation  Transaminitis Likely related to alcohol abuse +/- Remdesivir +/- COVID - essentially resolved at time of d/c w/ normal AST and ALT only very mildly elevated at 49 (trending down from 56)  Alcohol abuse Last drink reportedly greater than 48 hours from date of admission - no evidence of withdrawal during this admission    Allergies as of 11/22/2019   No Known Allergies     Medication List    STOP taking these medications   cholecalciferol 25 MCG (1000 UNIT) tablet Commonly known as: VITAMIN D     TAKE these medications   acetaminophen 325 MG tablet Commonly known as: TYLENOL Take 2 tablets (650 mg total) by mouth every 6 (six) hours as needed for mild pain or headache (fever >/= 101).   hydrALAZINE 100 MG tablet Commonly known as: APRESOLINE Take 1 tablet (100 mg total) by mouth every 8 (eight) hours.   isosorbide dinitrate 30 MG tablet Commonly known as: ISORDIL Take 1 tablet (30 mg total) by mouth 3 (three) times daily.   metoprolol tartrate 25 MG tablet Commonly known as: LOPRESSOR Take 0.5 tablets (12.5 mg total) by mouth 2 (two) times daily. What changed: when to take this       Day of Discharge BP (!) 153/93   Pulse 91   Temp 98.5 F (36.9 C) (Oral)   Resp 18   Ht 6\' 1"  (1.854 m)   Wt 88.5 kg   SpO2 95%   BMI 25.73 kg/m   Physical Exam: General: No acute respiratory distress Lungs: Clear to auscultation bilaterally without wheezes or crackles Cardiovascular: Regular rate and rhythm without murmur gallop or rub normal S1 and S2 Abdomen:  Nontender, nondistended, soft, bowel sounds positive, no rebound, no ascites, no appreciable mass Extremities: No significant cyanosis, clubbing, or edema bilateral lower extremities  Basic Metabolic Panel: Recent Labs  Lab 11/16/19 0250 11/17/19 0315 11/18/19 0251 11/19/19 0209 11/21/19 0336  NA 131* 134* 135 136 138  K 3.5 4.0 3.9 3.7 4.4  CL 100 103 101 102 103  CO2 19* 21* 22 24 23   GLUCOSE  143* 120* 132* 75 86  BUN 21 24* 21 17 13   CREATININE 1.07 0.94 1.05 1.02 0.94  CALCIUM 9.3 9.3 9.2 8.9 9.4    Liver Function Tests: Recent Labs  Lab 11/16/19 0250 11/17/19 0315 11/18/19 0251 11/19/19 0209 11/21/19 0336  AST 33 30 51* 51* 37  ALT 33 33 48* 56* 49*  ALKPHOS 40 40 43 45 54  BILITOT 0.5 0.3 0.3 0.3 0.4  PROT 7.3 6.7 7.0 6.4* 7.0  ALBUMIN 2.4* 2.3* 2.5* 2.3* 2.7*    CBC: Recent Labs  Lab 11/16/19 0250 11/17/19 0315 11/18/19 0251 11/19/19 0209  WBC 11.6* 12.9* 13.4* 14.2*  NEUTROABS 9.1* 9.7* 10.1*  --   HGB 13.9 13.3 13.2 12.7*  HCT 41.1 39.6 38.8* 38.5*  MCV 89.7 91.0 90.7 91.4  PLT 528* 588* 667* 698*    CBG: Recent Labs  Lab 11/16/19 0720 11/16/19 1118 11/16/19 1523 11/17/19 1603  GLUCAP 108* 124* 107* 128*    Recent Results (from the past 240 hour(s))  Blood Culture (routine x 2)     Status: None (Preliminary result)   Collection Time: 11/14/19  3:19 PM   Specimen: BLOOD  Result Value Ref Range Status   Specimen Description BLOOD RIGHT ANTECUBITAL  Final   Special Requests   Final    BOTTLES DRAWN AEROBIC AND ANAEROBIC Blood Culture adequate volume Performed at [facility], [address] [place], [address] [place]    Culture NO GROWTH 4 DAYS  Final   Report Status PENDING  Incomplete  Blood Culture (routine x 2)     Status: None (Preliminary result)   Collection Time: 11/14/19  3:36 PM   Specimen: BLOOD LEFT WRIST  Result Value Ref Range Status   Specimen Description BLOOD LEFT WRIST  Final   Special Requests   Final    BOTTLES DRAWN AEROBIC AND ANAEROBIC Blood Culture adequate volume   Culture NO GROWTH 4 DAYS  Final   Report Status PENDING  Incomplete      Time spent in discharge (includes decision making & examination of pt): 30 minutes  11/22/2019, 1:14 PM   11/16/19, MD [facility] Office  [phone]

## 2019-11-21 NOTE — Progress Notes (Signed)
William Pruitt  GQQ:[id number] DOB: [birth year] DOA: 11/14/2019 PCP: Patient, No Pcp Per    Brief Narrative:  64 year old with a history of HTN who presented to the ED 2/21 with mild cough and diarrhea after a known exposure to Covid.  He was found to be hypoxemic and a CXR noted bilateral infiltrates.  His SARS-CoV-2 antigen test was positive.  Significant Events: 2/21 admit to [facility] via [facility] ED  COVID-19 specific Treatment: Remdesivir 2/21 > 2/25 Decadron 2/21 > 2/26  Antimicrobials:  None  Subjective: Ready for d/c when venue for safe d/c can be assured (pt currently homeless).   Assessment & Plan:  COVID Pneumonia - acute hypoxic respiratory failure Has completed a course of remdesivir - discontinued steroid 2/26 as pt was no longer requiring O2 support and steroid may have been worsening his BP control   Uncontrolled HTN Blood pressure improving - ongoing care will be challenging given patient's homelessness - TOC to assist in establishing patient with a primary care physician and medications prior to d/c   Acute kidney injury Creatinine has normalized -peaked at 1.55 during this hospital stay   Hyponatremia Likely due to alcoholism -resolving in absence of alcohol and with volume resuscitation  Transaminitis Likely related to alcohol abuse +/- Remdesivir +/- COVID - stable at this time - will need f/u in outpt setting   Alcohol abuse Last drink reportedly greater than 48 hours from date of admission - no evidence of withdrawal   DVT prophylaxis: Lovenox Code Status: FULL CODE Family Communication:  Disposition Plan: Medically the patient is ready for discharge but his homelessness will greatly complicate his disposition - d/c when PCP, medications, and shelter arranged   Consultants:  none  Objective: Blood pressure (!) 144/94, pulse 68, temperature 98.3 F (36.8 C), temperature source Oral, resp. rate 16, height 6\' 1"  (1.854 m), weight 88.5 kg, SpO2 98  %.  Intake/Output Summary (Last 24 hours) at 11/21/2019 1723 Last data filed at 11/21/2019 0500 Gross per 24 hour  Intake 740 ml  Output 2500 ml  Net -1760 ml   Filed Weights   11/14/19 1637  Weight: 88.5 kg    Examination: General: No acute respiratory distress Lungs: CTA B - no wheezing  Cardiovascular: Regular rate and rhythm Abdomen: NT/ND, soft, bs+, no mass  Extremities: No edema B LE   CBC: Recent Labs  Lab 11/16/19 0250 11/16/19 0250 11/17/19 0315 11/18/19 0251 11/19/19 0209  WBC 11.6*   < > 12.9* 13.4* 14.2*  NEUTROABS 9.1*  --  9.7* 10.1*  --   HGB 13.9   < > 13.3 13.2 12.7*  HCT 41.1   < > 39.6 38.8* 38.5*  MCV 89.7   < > 91.0 90.7 91.4  PLT 528*   < > 588* 667* 698*   < > = values in this interval not displayed.   Basic Metabolic Panel: Recent Labs  Lab 11/18/19 0251 11/19/19 0209 11/21/19 0336  NA 135 136 138  K 3.9 3.7 4.4  CL 101 102 103  CO2 22 24 23   GLUCOSE 132* 75 86  BUN 21 17 13   CREATININE 1.05 1.02 0.94  CALCIUM 9.2 8.9 9.4   GFR: Estimated Creatinine Clearance: 88.5 mL/min (by C-G formula based on SCr of 0.94 mg/dL).  Liver Function Tests: Recent Labs  Lab 11/17/19 0315 11/18/19 0251 11/19/19 0209 11/21/19 0336  AST 30 51* 51* 37  ALT 33 48* 56* 49*  ALKPHOS 40 43 45 54  BILITOT  0.3 0.3 0.3 0.4  PROT 6.7 7.0 6.4* 7.0  ALBUMIN 2.3* 2.5* 2.3* 2.7*    CBG: Recent Labs  Lab 11/16/19 0720 11/16/19 1118 11/16/19 1523 11/17/19 1603  GLUCAP 108* 124* 107* 128*    Recent Results (from the past 240 hour(s))  Blood Culture (routine x 2)     Status: None (Preliminary result)   Collection Time: 11/14/19  3:19 PM   Specimen: BLOOD  Result Value Ref Range Status   Specimen Description BLOOD RIGHT ANTECUBITAL  Final   Special Requests   Final    BOTTLES DRAWN AEROBIC AND ANAEROBIC Blood Culture adequate volume Performed at [facility], [address] [place], [place] [address]    Culture NO GROWTH 4 DAYS  Final    Report Status PENDING  Incomplete  Blood Culture (routine x 2)     Status: None (Preliminary result)   Collection Time: 11/14/19  3:36 PM   Specimen: BLOOD LEFT WRIST  Result Value Ref Range Status   Specimen Description BLOOD LEFT WRIST  Final   Special Requests   Final    BOTTLES DRAWN AEROBIC AND ANAEROBIC Blood Culture adequate volume   Culture NO GROWTH 4 DAYS  Final   Report Status PENDING  Incomplete     Scheduled Meds: . docusate sodium  100 mg Oral BID  . enoxaparin (LOVENOX) injection  40 mg Subcutaneous Q24H  . folic acid  1 mg Oral Daily  . hydrALAZINE  100 mg Oral Q8H  . isosorbide dinitrate  30 mg Oral TID  . metoprolol tartrate  12.5 mg Oral BID  . multivitamin with minerals  1 tablet Oral Daily  . thiamine  100 mg Oral Daily      LOS: 7 days   Lonia Blood, MD [facility] Office  [phone] Pager - Text Page per Amion  If 7PM-7AM, please contact night-coverage per Amion 11/21/2019, 5:23 PM

## 2019-11-21 NOTE — Clinical Social Work Note (Signed)
Previous handoff received stated [facility] staff working on safe dispo. No previous TOC notes. Will follow up on Monday.

## 2019-11-22 MED FILL — hydrALAZINE HCL 100 MG TABS: 100 | 30 days supply | Qty: 90 | Fill #0

## 2019-11-22 MED FILL — ISOSORBIDE DN 30 MG TABLET: 30 | 30 days supply | Qty: 90 | Fill #0

## 2019-11-22 MED FILL — METOPROLOL TARTRATE 25 MG T: 25 | 30 days supply | Qty: 30 | Fill #0

## 2019-11-22 NOTE — Progress Notes (Signed)
Patient discharging to hotel tonight set up by Child psychotherapist. Pharmacy will deliver patient's medications to the bedside prior to transport. Patient educated on discharge instructions and understands to continue to quarantine for 14 days after discharge. All questions answered patient has no questions at this time.

## 2019-11-23 LAB — CULTURE, BLOOD (ROUTINE X 2)
Culture: NO GROWTH
Culture: NO GROWTH
Special Requests: ADEQUATE
Special Requests: ADEQUATE

## 2019-11-23 NOTE — TOC Initial Note (Signed)
Transition of Care [facility]) - Initial/Assessment Note    Patient Details  Name: NEYTHAN KOZLOV MRN: [id number] Date of Birth: [birth year]  [facility]) CM/SW Contact:    Dashun Borre, Joyice Faster, LCSW Phone Number: 11/23/2019, 12:06 AM  Clinical Narrative:                  Clinical Social Worker spoke with patient over the phone to offer support and discuss patient needs at discharge.  Patient states that he was living in a boarding house prior to hospitalization, however can not return due to pandemic restrictions.  Patient agreeable to placement at the homeless COVID hotel.  CSW spoke with Darlina Sicilian from [facility] who facilitated the transition for the health department.  CSW was able to make arrangements with [facility] to address patient need for medications at discharge.  [facility] Department is to provide patient with transportation between 6:30-7:00pm.  Patient with original paperwork and copy faxed to Health Department.  RN and MD updated.  Patient to notify brother of whereabouts so he can bring him his belongings.  Patient aware that he will remain in the hotel through his quarantine and then likely need to transition into another [employer] or possibly a homeless shelter.  Patient remains on board with this plan.  Expected Discharge Plan: Home/Self Care(Patient currently lives at [facility]) Barriers to Discharge: Barriers Resolved   Patient Goals and CMS Choice Patient states their goals for this hospitalization and ongoing recovery are:: Patient states that he is ready to get out of the hospital and more importantly isolation   Choice offered to / list presented to : Patient  Expected Discharge Plan and Services Expected Discharge Plan: Home/Self Care(Patient currently lives at [facility]) In-house Referral: Clinical Social Work Discharge Planning Services: [facility]     Expected Discharge Date: 11/22/19                                     Prior Living Arrangements/Services   Lives with:: Roommate   Do you feel safe going back to the place where you live?: No   Patient from boarding house with COVID outbreak - patient refusing to go back.           Activities of Daily Living Home Assistive Devices/Equipment: None ADL Screening (condition at time of admission) Patient's cognitive ability adequate to safely complete daily activities?: Yes Is the patient deaf or have difficulty hearing?: No Does the patient have difficulty seeing, even when wearing glasses/contacts?: No Does the patient have difficulty concentrating, remembering, or making decisions?: No Patient able to express need for assistance with ADLs?: Yes Does the patient have difficulty dressing or bathing?: No Independently performs ADLs?: Yes (appropriate for developmental age) Does the patient have difficulty walking or climbing stairs?: Yes Weakness of Legs: Both Weakness of Arms/Hands: Both  Permission Sought/Granted Permission sought to share information with : Family Supports, Other (comment)([facility] / Health Department) Permission granted to share information with : Yes, Verbal Permission Granted              Emotional Assessment   Attitude/Demeanor/Rapport: Gracious, Self-Confident, Engaged Affect (typically observed): Unable to Assess Orientation: : Oriented to Situation, Oriented to  Time, Oriented to Self Alcohol / Substance Use: Not Applicable Psych Involvement: No (comment)  Admission diagnosis:  Shortness of breath [R06.02] Pneumonia due to COVID-19 virus [  U07.1, J12.82] COVID-19 [U07.1] Patient Active Problem List   Diagnosis Date Noted  . ETOH abuse 11/14/2019  . Pneumonia due to COVID-19 virus 11/14/2019   PCP:  Patient, No Pcp Per Pharmacy:   [place], [place] - [address] [phone] [place] [address] Phone: [phone] Fax: [phone]     Social Determinants of Health (SDOH) Interventions    Readmission Risk Interventions No flowsheet data found.   Barbette Or, [place]

## 2019-11-23 NOTE — TOC Transition Note (Signed)
Transition of Care [facility]) - CM/SW Discharge Note   Patient Details  Name: ALBIE ARIZPE MRN: [id number] Date of Birth: 04-03-1956  Transition of Care [facility]) CM/SW Contact:  Yosgar Demirjian, Joyice Faster, LCSW Phone Number: 11/23/2019, 12:16 AM   Clinical Narrative:     Clinical Social Worker completed final arrangements for patient to discharge to COVID hotel through the Health Department.  CSW received confirmation of plans for patient to transition this evening.  Clinical Social Worker will sign off for now as social work intervention is no longer needed. Please consult [place] again if new need arises.  Final next level of care: Other (comment) Barriers to Discharge: Barriers Resolved   Patient Goals and CMS Choice Patient states their goals for this hospitalization and ongoing recovery are:: Patient states that he is ready to get out of the hospital and more importantly isolation   Choice offered to / list presented to : Patient  Discharge Placement                Patient to be transferred to facility by: Health Department Transport Name of family member notified: Patient to update brother Patient and family notified of of transfer: 11/23/19  Discharge Plan and Services In-house Referral: Clinical Social Work Discharge Planning Services: [facility] Program                                 Social Determinants of Health (SDOH) Interventions     Readmission Risk Interventions No flowsheet data found.   Macario Golds, [place] [phone]

## 2020-02-03 ENCOUNTER — Ambulatory Visit: Payer: Self-pay | Attending: Internal Medicine

## 2020-02-03 ENCOUNTER — Other Ambulatory Visit: Payer: Self-pay

## 2020-02-03 DIAGNOSIS — Z23 Encounter for immunization: Secondary | ICD-10-CM

## 2020-02-03 NOTE — Progress Notes (Signed)
   Covid-19 Vaccination Clinic  Name:  William Pruitt    MRN: [id number] DOB: 06/30/1956  02/03/2020  William Pruitt was observed post Covid-19 immunization for 15 minutes without incident. He was provided with Vaccine Information Sheet and instruction to access the V-Safe system.   William Pruitt was instructed to call 911 with any severe reactions post vaccine: Marland Kitchen Difficulty breathing  . Swelling of face and throat  . A fast heartbeat  . A bad rash all over body  . Dizziness and weakness   Immunizations Administered    Name Date Dose VIS Date Route   Moderna COVID-19 Vaccine 02/03/2020 10:26 AM 0.5 mL 08/2019 Intramuscular   Manufacturer: Moderna   Lot: [id number]   NDC: 89784-784-12

## 2020-02-10 ENCOUNTER — Other Ambulatory Visit: Payer: Self-pay

## 2020-02-10 ENCOUNTER — Emergency Department (HOSPITAL_COMMUNITY)
Admission: EM | Admit: 2020-02-10 | Discharge: 2020-02-10 | Disposition: A | Discharge status: Home / Self Care | Payer: Medicaid Other | Attending: Emergency Medicine | Admitting: Emergency Medicine

## 2020-02-10 ENCOUNTER — Emergency Department (HOSPITAL_COMMUNITY): Payer: Medicaid Other

## 2020-02-10 DIAGNOSIS — R079 Chest pain, unspecified: Secondary | ICD-10-CM | POA: Diagnosis not present

## 2020-02-10 DIAGNOSIS — Z03818 Encounter for observation for suspected exposure to other biological agents ruled out: Secondary | ICD-10-CM | POA: Diagnosis not present

## 2020-02-10 DIAGNOSIS — R0789 Other chest pain: Secondary | ICD-10-CM | POA: Insufficient documentation

## 2020-02-10 DIAGNOSIS — Z5321 Procedure and treatment not carried out due to patient leaving prior to being seen by health care provider: Secondary | ICD-10-CM | POA: Diagnosis not present

## 2020-02-10 DIAGNOSIS — R0902 Hypoxemia: Secondary | ICD-10-CM | POA: Diagnosis not present

## 2020-02-10 LAB — BASIC METABOLIC PANEL
Anion gap: 13 (ref 5–15)
BUN: 14 mg/dL (ref 8–23)
CO2: 20 mmol/L — ABNORMAL LOW (ref 22–32)
Calcium: 9 mg/dL (ref 8.9–10.3)
Chloride: 100 mmol/L (ref 98–111)
Creatinine, Ser: 1.15 mg/dL (ref 0.61–1.24)
GFR calc Af Amer: >60 mL/min (ref >60)
GFR calc non Af Amer: >60 mL/min (ref >60)
Glucose, Bld: 85 mg/dL (ref 70–99)
Potassium: 4 mmol/L (ref 3.5–5.1)
Sodium: 133 mmol/L — ABNORMAL LOW (ref 135–145)

## 2020-02-10 LAB — CBC
HCT: 39 % (ref 39.0–52.0)
Hemoglobin: 12.5 g/dL — ABNORMAL LOW (ref 13.0–17.0)
MCH: 30.6 pg (ref 26.0–34.0)
MCHC: 32.1 g/dL (ref 30.0–36.0)
MCV: 95.6 fL (ref 80.0–100.0)
Platelets: 279 10*3/uL (ref 150–400)
RBC: 4.08 MIL/uL — ABNORMAL LOW (ref 4.22–5.81)
RDW: 13.8 % (ref 11.5–15.5)
WBC: 8.2 10*3/uL (ref 4.0–10.5)
nRBC: 0 % (ref 0.0–0.2)

## 2020-02-10 LAB — TROPONIN I (HIGH SENSITIVITY): Troponin I (High Sensitivity): 12 ng/L (ref <18)

## 2020-02-10 MED ORDER — SODIUM CHLORIDE 0.9% FLUSH: 3.0000 mL | Freq: Once | INTRAVENOUS | Status: DC

## 2020-02-10 NOTE — ED Notes (Signed)
No response for reassessment.  

## 2020-02-10 NOTE — ED Triage Notes (Signed)
Pt here from [facility] for evaluation of central chest pain. Resolved on EMS arrival, given 324 ASA. No pain on arrival.

## 2020-02-10 NOTE — ED Notes (Signed)
Pt called for reassessment x3. No response

## 2020-02-21 DIAGNOSIS — Z03818 Encounter for observation for suspected exposure to other biological agents ruled out: Secondary | ICD-10-CM | POA: Diagnosis not present

## 2020-02-23 ENCOUNTER — Encounter: Payer: Self-pay | Admitting: Critical Care Medicine

## 2020-02-23 NOTE — Progress Notes (Signed)
Patient ID: William Pruitt, male   DOB: 09/07/1956, 64 y.o.   MRN: [id number] Is a 64 year old male seen for the first time the [facility] has been here for about 1 month.  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  He was admitted in February for Covid pneumonia and recovered from this and has had one vaccine of Materna on May 13 his second vaccine is due June 13.  He complains of not understanding his blood pressure medicines at discharge he is supposed to be on metoprolol one half 25 mg twice daily isosorbide 30 mg 3 times daily hydralazine 100 Legrand 3 times daily instead he is taking hydralazine only once a day the isosorbide twice a day and he is unsure of how he is taken to metoprolol.  He continues to drink 2 beers daily.  He is on the Flomax 0.4 mg daily Symbicort 2 puffs twice daily albuterol as needed vitamin D 25 mcg daily Loratadine 10 mg daily he does not need prescriptions but just needs closer observation of his medication profile he obtained all his medicines already from the [place] and he does demonstrated this today that he has a good supply of all his medications on exam blood pressure 194/96 pulse is 93 saturation 97% room air the exam is unremarkable impression is that of hypertension poorly controlled due to lack of insight on proper use of his medications  I instructed the patient to increase his metoprolol to one half 25 mg twice daily for a total dose of 50 mg daily he is also to increase isosorbide to 3 times daily 30 mg increase hydralazine to 100 mg 3 times daily and he is to follow-up with his primary care physician in the [place] system which she has an upcoming appointment in a week

## 2020-02-25 NOTE — Congregational Nurse Program (Signed)
Client seen at [facility] clinic for clarification of his medication dosing. William Pruitt is due for 2nd Vaccine shot. Will f/u with b/p monitoring

## 2020-03-06 DIAGNOSIS — Z03818 Encounter for observation for suspected exposure to other biological agents ruled out: Secondary | ICD-10-CM | POA: Diagnosis not present

## 2020-03-21 DIAGNOSIS — Z03818 Encounter for observation for suspected exposure to other biological agents ruled out: Secondary | ICD-10-CM | POA: Diagnosis not present

## 2020-03-29 ENCOUNTER — Encounter: Payer: Self-pay | Admitting: Critical Care Medicine

## 2020-03-29 NOTE — Progress Notes (Signed)
Patient ID: William Pruitt, male   DOB: 07/13/1956, 64 y.o.   MRN: [id number] Patient presents today to the shelter clinic as a resident here and did receive the second Covid vaccine just yesterday.  His first vaccine was on May 13.  He received the maternal vaccine.  He complains of redness and pain in the left arm and weakness and fatigue.  On exam blood pressure 170/102 pulse 93 saturation 99% left arm is swollen has evidence of erythema chest is clear cardiac exam showed a resting tachycardia normal S1-S2 abdomen soft nontender extremities show no edema or clubbing  Impression for this patient is diet of reaction to the Covid virus virus vaccine.  Plan will be to administer ibuprofen and Tylenol as needed in combination I gave him a dose of this today and also have asked him to increase his hydration and also to resume his blood pressure medications which he had held

## 2020-04-14 ENCOUNTER — Emergency Department (HOSPITAL_COMMUNITY): Payer: Medicare Other

## 2020-04-14 ENCOUNTER — Other Ambulatory Visit: Payer: Self-pay

## 2020-04-14 ENCOUNTER — Inpatient Hospital Stay (HOSPITAL_COMMUNITY)
Admission: EM | Admit: 2020-04-14 | Discharge: 2020-04-18 | DRG: 192 | Disposition: A | Discharge status: Home / Self Care | Payer: Medicare Other | Attending: Internal Medicine | Admitting: Internal Medicine

## 2020-04-14 ENCOUNTER — Encounter (HOSPITAL_COMMUNITY): Payer: Self-pay | Admitting: *Deleted

## 2020-04-14 DIAGNOSIS — I160 Hypertensive urgency: Secondary | ICD-10-CM | POA: Diagnosis present

## 2020-04-14 DIAGNOSIS — Z20822 Contact with and (suspected) exposure to covid-19: Secondary | ICD-10-CM | POA: Diagnosis present

## 2020-04-14 DIAGNOSIS — Z8616 Personal history of COVID-19: Secondary | ICD-10-CM

## 2020-04-14 DIAGNOSIS — Z9114 Patient's other noncompliance with medication regimen: Secondary | ICD-10-CM | POA: Diagnosis not present

## 2020-04-14 DIAGNOSIS — J441 Chronic obstructive pulmonary disease with (acute) exacerbation: Secondary | ICD-10-CM | POA: Diagnosis present

## 2020-04-14 DIAGNOSIS — R0602 Shortness of breath: Secondary | ICD-10-CM | POA: Diagnosis present

## 2020-04-14 DIAGNOSIS — I10 Essential (primary) hypertension: Secondary | ICD-10-CM | POA: Diagnosis present

## 2020-04-14 DIAGNOSIS — F1010 Alcohol abuse, uncomplicated: Secondary | ICD-10-CM | POA: Diagnosis present

## 2020-04-14 DIAGNOSIS — J432 Centrilobular emphysema: Principal | ICD-10-CM | POA: Diagnosis present

## 2020-04-14 DIAGNOSIS — Z590 Homelessness: Secondary | ICD-10-CM

## 2020-04-14 DIAGNOSIS — F17210 Nicotine dependence, cigarettes, uncomplicated: Secondary | ICD-10-CM | POA: Diagnosis present

## 2020-04-14 DIAGNOSIS — R0600 Dyspnea, unspecified: Secondary | ICD-10-CM | POA: Insufficient documentation

## 2020-04-14 DIAGNOSIS — R32 Unspecified urinary incontinence: Secondary | ICD-10-CM | POA: Diagnosis present

## 2020-04-14 LAB — COMPREHENSIVE METABOLIC PANEL
ALT: 20 U/L (ref 0–44)
AST: 27 U/L (ref 15–41)
Albumin: 3.9 g/dL (ref 3.5–5.0)
Alkaline Phosphatase: 57 U/L (ref 38–126)
Anion gap: 11 (ref 5–15)
BUN: 10 mg/dL (ref 8–23)
CO2: 23 mmol/L (ref 22–32)
Calcium: 9.5 mg/dL (ref 8.9–10.3)
Chloride: 98 mmol/L (ref 98–111)
Creatinine, Ser: 1.07 mg/dL (ref 0.61–1.24)
GFR calc Af Amer: >60 mL/min (ref >60)
GFR calc non Af Amer: >60 mL/min (ref >60)
Glucose, Bld: 104 mg/dL — ABNORMAL HIGH (ref 70–99)
Potassium: 4.5 mmol/L (ref 3.5–5.1)
Sodium: 132 mmol/L — ABNORMAL LOW (ref 135–145)
Total Bilirubin: 0.4 mg/dL (ref 0.3–1.2)
Total Protein: 8.3 g/dL — ABNORMAL HIGH (ref 6.5–8.1)

## 2020-04-14 LAB — CBC WITH DIFFERENTIAL/PLATELET
Abs Immature Granulocytes: 0.05 10*3/uL (ref 0.00–0.07)
Basophils Absolute: 0 10*3/uL (ref 0.0–0.1)
Basophils Relative: 0 %
Eosinophils Absolute: 0.1 10*3/uL (ref 0.0–0.5)
Eosinophils Relative: 1 %
HCT: 42.4 % (ref 39.0–52.0)
Hemoglobin: 13.8 g/dL (ref 13.0–17.0)
Immature Granulocytes: 1 %
Lymphocytes Relative: 8 %
Lymphs Abs: 0.8 10*3/uL (ref 0.7–4.0)
MCH: 30.3 pg (ref 26.0–34.0)
MCHC: 32.5 g/dL (ref 30.0–36.0)
MCV: 93 fL (ref 80.0–100.0)
Monocytes Absolute: 1 10*3/uL (ref 0.1–1.0)
Monocytes Relative: 10 %
Neutro Abs: 8.3 10*3/uL — ABNORMAL HIGH (ref 1.7–7.7)
Neutrophils Relative %: 80 %
Platelets: 266 10*3/uL (ref 150–400)
RBC: 4.56 MIL/uL (ref 4.22–5.81)
RDW: 13.2 % (ref 11.5–15.5)
WBC: 10.2 10*3/uL (ref 4.0–10.5)
nRBC: 0 % (ref 0.0–0.2)

## 2020-04-14 LAB — ECHOCARDIOGRAM COMPLETE
Area-P 1/2: 6.17 cm2
Height: 74 in
S' Lateral: 2.75 cm
Weight: 4208 oz

## 2020-04-14 LAB — BRAIN NATRIURETIC PEPTIDE: B Natriuretic Peptide: 112.7 pg/mL — ABNORMAL HIGH (ref 0.0–100.0)

## 2020-04-14 LAB — SARS CORONAVIRUS 2 BY RT PCR (HOSPITAL ORDER, PERFORMED IN ~~LOC~~ HOSPITAL LAB): SARS Coronavirus 2: NEGATIVE

## 2020-04-14 LAB — TROPONIN I (HIGH SENSITIVITY)
Troponin I (High Sensitivity): 24 ng/L — ABNORMAL HIGH (ref <18)
Troponin I (High Sensitivity): 24 ng/L — ABNORMAL HIGH (ref <18)

## 2020-04-14 MED ORDER — ISOSORBIDE DINITRATE 30 MG PO TABS
30.0000 mg | ORAL_TABLET | Freq: Three times a day (TID) | ORAL | Status: DC
  Administered 2020-04-14 – 2020-04-18 (×13): 30 mg via ORAL
  Filled 2020-04-14 (×15): qty 1

## 2020-04-14 MED ORDER — IOHEXOL 350 MG/ML SOLN
100.0000 mL | Freq: Once | INTRAVENOUS | Status: AC | PRN
  Administered 2020-04-14: 100 mL via INTRAVENOUS

## 2020-04-14 MED ORDER — PREDNISONE 20 MG PO TABS: 40.0000 mg | ORAL_TABLET | Freq: Every day | ORAL | Status: DC

## 2020-04-14 MED ORDER — HYDRALAZINE HCL 20 MG/ML IJ SOLN
20.0000 mg | Freq: Once | INTRAMUSCULAR | Status: AC
  Administered 2020-04-14: 20 mg via INTRAVENOUS
  Filled 2020-04-14: qty 1

## 2020-04-14 MED ORDER — METOPROLOL TARTRATE 12.5 MG HALF TABLET
12.5000 mg | ORAL_TABLET | Freq: Every day | ORAL | Status: DC
  Administered 2020-04-14 – 2020-04-15 (×2): 12.5 mg via ORAL
  Filled 2020-04-14 (×2): qty 1

## 2020-04-14 MED ORDER — LABETALOL HCL 5 MG/ML IV SOLN
5.0000 mg | INTRAVENOUS | Status: DC | PRN
  Administered 2020-04-14: 5 mg via INTRAVENOUS
  Filled 2020-04-14: qty 4

## 2020-04-14 MED ORDER — ACETAMINOPHEN 650 MG RE SUPP: 650.0000 mg | Freq: Four times a day (QID) | RECTAL | Status: DC | PRN

## 2020-04-14 MED ORDER — HYDRALAZINE HCL 50 MG PO TABS
100.0000 mg | ORAL_TABLET | Freq: Three times a day (TID) | ORAL | Status: DC
  Administered 2020-04-14 – 2020-04-18 (×13): 100 mg via ORAL
  Filled 2020-04-14 (×13): qty 2

## 2020-04-14 MED ORDER — IPRATROPIUM-ALBUTEROL 0.5-2.5 (3) MG/3ML IN SOLN
3.0000 mL | RESPIRATORY_TRACT | Status: AC | PRN
  Administered 2020-04-15 – 2020-04-16 (×4): 3 mL via RESPIRATORY_TRACT
  Filled 2020-04-14 (×4): qty 3

## 2020-04-14 MED ORDER — PREDNISONE 20 MG PO TABS
40.0000 mg | ORAL_TABLET | Freq: Every day | ORAL | Status: DC
  Administered 2020-04-14 – 2020-04-18 (×5): 40 mg via ORAL
  Filled 2020-04-14 (×5): qty 2

## 2020-04-14 MED ORDER — PERFLUTREN LIPID MICROSPHERE
1.0000 mL | INTRAVENOUS | Status: AC | PRN
  Administered 2020-04-14: 2 mL via INTRAVENOUS
  Filled 2020-04-14: qty 10

## 2020-04-14 MED ORDER — HYDRALAZINE HCL 25 MG PO TABS
100.0000 mg | ORAL_TABLET | Freq: Once | ORAL | Status: AC
  Administered 2020-04-14: 100 mg via ORAL
  Filled 2020-04-14: qty 4

## 2020-04-14 MED ORDER — SENNOSIDES-DOCUSATE SODIUM 8.6-50 MG PO TABS: 1.0000 | ORAL_TABLET | Freq: Every evening | ORAL | Status: DC | PRN

## 2020-04-14 MED ORDER — IPRATROPIUM-ALBUTEROL 0.5-2.5 (3) MG/3ML IN SOLN
3.0000 mL | RESPIRATORY_TRACT | Status: AC
  Administered 2020-04-14 (×4): 3 mL via RESPIRATORY_TRACT
  Filled 2020-04-14 (×4): qty 3

## 2020-04-14 MED ORDER — FUROSEMIDE 10 MG/ML IJ SOLN
40.0000 mg | Freq: Once | INTRAMUSCULAR | Status: AC
  Administered 2020-04-14: 40 mg via INTRAVENOUS
  Filled 2020-04-14: qty 4

## 2020-04-14 MED ORDER — AZITHROMYCIN 250 MG PO TABS
500.0000 mg | ORAL_TABLET | Freq: Every day | ORAL | Status: AC
  Administered 2020-04-14: 500 mg via ORAL
  Filled 2020-04-14: qty 2

## 2020-04-14 MED ORDER — LABETALOL HCL 5 MG/ML IV SOLN
10.0000 mg | INTRAVENOUS | Status: DC | PRN
  Administered 2020-04-14: 10 mg via INTRAVENOUS
  Filled 2020-04-14 (×2): qty 4

## 2020-04-14 MED ORDER — AZITHROMYCIN 250 MG PO TABS
250.0000 mg | ORAL_TABLET | Freq: Every day | ORAL | Status: AC
  Administered 2020-04-15 – 2020-04-18 (×4): 250 mg via ORAL
  Filled 2020-04-14 (×4): qty 1

## 2020-04-14 MED ORDER — ACETAMINOPHEN 325 MG PO TABS
650.0000 mg | ORAL_TABLET | Freq: Four times a day (QID) | ORAL | Status: DC | PRN
  Filled 2020-04-14: qty 2

## 2020-04-14 MED ORDER — ENOXAPARIN SODIUM 40 MG/0.4ML ~~LOC~~ SOLN
40.0000 mg | SUBCUTANEOUS | Status: DC
  Administered 2020-04-14 – 2020-04-18 (×6): 40 mg via SUBCUTANEOUS
  Filled 2020-04-14 (×5): qty 0.4

## 2020-04-14 NOTE — Progress Notes (Signed)
  Echocardiogram 2D Echocardiogram with definity has been performed.  Leta Jungling M 04/14/2020, 10:53 AM

## 2020-04-14 NOTE — ED Notes (Signed)
Pt sitting on side of bed.  St's he is tired of laying down.

## 2020-04-14 NOTE — H&P (Signed)
Date: 04/14/2020               Patient Name:  William Pruitt MRN: [id number]  DOB: 31-Dec-1955 Age / Sex: 64 y.o., male   PCP: Patient, No Pcp Per         Medical Service: Internal Medicine Teaching Service         Attending Physician: Dr. Reymundo Poll, MD    First Contact: Dr. Renaldo Fiddler Pager: [phone]  Second Contact: Dr. Gwyneth Revels Pager: [phone]       After Hours (After 5p/  First Contact Pager: [phone]  weekends / holidays): Second Contact Pager: [phone]   Chief Complaint: shortness of breath  History of Present Illness:   William Pruitt is a 64yo male seen by the [place] with PMH of hypertension and covid-19 (Feb 2021) presenting with shortness of breath which started yesterday. He states he usually does not have shortness of breath since he had covid, but that this began to develop yesterday and gradually worsened throughout the day. Previously he was able to lay flat but cannot now. He has a cough productive of clear sputum. He also is having dyspnea on exertion, which is not usual for him. He denies chest pain, palpitations or lower extremity swelling. He has not noticed increased weight gain. He does not think he has history of heart failure or COPD. He did see a pulmonologist recently for the first time one month ago but cannot remember what they said. He does not use an inhaler.  He does have chronic issues with urination, most notably incontinence although he can usually make it to the restroom. He denies dysuria, changes in bowel movements, dizziness.   ED course:  HR 96, BP 165/136, 94% requiring nonrebreather, now on 5L [place]. CXR showed interstitial edema and he was given 40mg  IV lasix. CTPE was done which showed atelectasis of both bases.   Social:   Lives in [place], [place] and goes to the [employer] in [place] Smokes 1ppd (although usually is sharing with friends) since age 61  He drinks 3 12oz beers per day No recreational drug use  Family History:   No family  history on file.   Meds:  Current Meds  Medication Sig  . acetaminophen (TYLENOL) 325 MG tablet Take 2 tablets (650 mg total) by mouth every 6 (six) hours as needed for mild pain or headache (fever >/= 101).  . hydrALAZINE (APRESOLINE) 100 MG tablet Take 1 tablet (100 mg total) by mouth every 8 (eight) hours.  . isosorbide dinitrate (ISORDIL) 30 MG tablet Take 1 tablet (30 mg total) by mouth 3 (three) times daily.  . metoprolol tartrate (LOPRESSOR) 25 MG tablet Take 0.5 tablets (12.5 mg total) by mouth 2 (two) times daily.     Allergies: Allergies as of 04/14/2020  . (No Known Allergies)   Past Medical History:  Diagnosis Date  . H/O ETOH abuse   . Hypertension      Review of Systems: A complete ROS was negative except as per HPI.   Physical Exam: Blood pressure (!) 210/109, pulse 90, temperature 97.7 F (36.5 C), temperature source Tympanic, resp. rate (!) 30, height 6\' 2"  (1.88 m), weight (!) 119.3 kg, SpO2 (!) 89 %.  Constitution: labored breathing otherwise NAD, sitting up in bed, appears stated stage HENT: [place]/AT Cardio: tachycardic, regular rhythm, no m/r/g; +JVP up to ear, legs appear dry Respiratory: on 5L [place], tachypnic, upper airway wheezing, mild basilar crackles bilaterally Abdominal: normal BS, NTTP,  distended but soft MSK: moving all extremities Neuro: alert and oriented, normal affect Skin: scattered small round nodular lesions .5cm diameter   EKG: personally reviewed my interpretation is LVH, atrial enlargement, sinus tachycardia  CXR: personally reviewed my interpretation is atelectasis, diffuse interstitial opacity  Assessment & Plan by Problem: Active Problems:   Dyspnea  [name] is a 64yo male seen by the [place] with PMH of hypertension and covid-19 (Feb 2021) presenting with shortness of breath which started yesterday.  Dyspnea Differential includes new onset HF vs. COPD exacerbation which may have been worsened by recent covid infection.  Trops flat 24>24. Xr did show interstitial opacity and he does have some JVP although legs actually look dry and are wrinkled s/p 40mg  IV lasix. CTA scan also showed additional atelectasis and emphysema, no PE or pulmonary edema.    - stat echo - will hold off additional diuretics for now - strict I/O, daily weights - duonebs q4h today, then prn tomorrow  - start prednisone 40 mg five days - will obtain records from [facility].   Hypertensive Urgency  I think his tachycardia has worsened with administration IV hydralazine. His home medications, imdur 30 mg tid, hydralazine 100 mg po q8h, metoprolol 12.5 mg ONCE PER DAY. Blood pressure continuing to be elevated after restarting imdur and hydral.   - cont. Home medications.  - restart metoprolol 12.5 mg qd - hydral prn, will give labetalol prn once echo done.   Urinary Incontinence Chronic issue. Renal function normal.   Diet: heart healthy VTE: lovenox IVF: none Code: full  Dispo: Admit patient to Inpatient with expected length of stay greater than 2 midnights.  [place], DO 04/14/2020, 10:59 AM  Pager: [phone]

## 2020-04-14 NOTE — Plan of Care (Signed)
New care plan added 

## 2020-04-14 NOTE — Plan of Care (Signed)

## 2020-04-14 NOTE — ED Triage Notes (Addendum)
Patient presents to ed via [facility] c/o sob onset 3 days ago states he has noticed weight gain in his abd. Sob worse today. Patient sats were 80% on room air placed on NRB and sats increased to 98% patient is able to talk in complete sentences.

## 2020-04-14 NOTE — ED Provider Notes (Signed)
[facility] EMERGENCY DEPARTMENT Provider Note   CSN: [id number] Arrival date & time: 04/14/20  0712     History Chief Complaint  Patient presents with  . Shortness of Breath    William Pruitt is a 64 y.o. male.  Patient presents to the emergency department for evaluation of difficulty breathing.  Patient reports that he has been feeling short of breath for about 3 days.  He has noticed that he has been experiencing increased shortness of breath whenever he tries to exert himself.  Over this time he has also noticed some swelling of his abdomen and legs.  He has not noticed any associated chest pain.  He has not had a fever but has had a cough.        Past Medical History:  Diagnosis Date  . H/O ETOH abuse   . Hypertension     Patient Active Problem List   Diagnosis Date Noted  . ETOH abuse 11/14/2019  . Pneumonia due to COVID-19 virus 11/14/2019    History reviewed. No pertinent surgical history.     No family history on file.  Social History   Tobacco Use  . Smoking status: Current Every Day Smoker  . Smokeless tobacco: Never Used  Substance Use Topics  . Alcohol use: Yes    Comment: 4 beers per day  . Drug use: Not Currently    Home Medications Prior to Admission medications   Medication Sig Start Date End Date Taking? Authorizing Provider  acetaminophen (TYLENOL) 325 MG tablet Take 2 tablets (650 mg total) by mouth every 6 (six) hours as needed for mild pain or headache (fever >/= 101). 11/21/19  Yes Lonia Blood, MD  hydrALAZINE (APRESOLINE) 100 MG tablet Take 1 tablet (100 mg total) by mouth every 8 (eight) hours. 11/21/19  Yes Lonia Blood, MD  isosorbide dinitrate (ISORDIL) 30 MG tablet Take 1 tablet (30 mg total) by mouth 3 (three) times daily. 11/21/19  Yes Lonia Blood, MD  metoprolol tartrate (LOPRESSOR) 25 MG tablet Take 0.5 tablets (12.5 mg total) by mouth 2 (two) times daily. 11/21/19  Yes Lonia Blood, MD      Allergies    Patient has no known allergies.  Review of Systems   Review of Systems  Respiratory: Positive for cough and shortness of breath.   All other systems reviewed and are negative.   Physical Exam Updated Vital Signs BP (!) 185/101   Pulse 101   Temp 97.7 F (36.5 C) (Tympanic)   Resp (!) 33   Ht 6\' 2"  (1.88 m)   Wt (!) 119.3 kg   SpO2 95%   BMI 33.77 kg/m   Physical Exam Vitals and nursing note reviewed.  Constitutional:      General: He is not in acute distress.    Appearance: Normal appearance. He is well-developed.  HENT:     Head: Normocephalic and atraumatic.     Right Ear: Hearing normal.     Left Ear: Hearing normal.     Nose: Nose normal.  Eyes:     Conjunctiva/sclera: Conjunctivae normal.     Pupils: Pupils are equal, round, and reactive to light.  Cardiovascular:     Rate and Rhythm: Regular rhythm. Tachycardia present.     Heart sounds: S1 normal and S2 normal. No murmur heard.  No friction rub. No gallop.   Pulmonary:     Effort: Tachypnea, accessory muscle usage and prolonged expiration present.  Breath sounds: Decreased breath sounds, wheezing and rales present.  Chest:     Chest wall: No tenderness.  Abdominal:     General: Bowel sounds are normal.     Palpations: Abdomen is soft.     Tenderness: There is no abdominal tenderness. There is no guarding or rebound. Negative signs include Murphy's sign and McBurney's sign.     Hernia: No hernia is present.  Musculoskeletal:        General: Normal range of motion.     Cervical back: Normal range of motion and neck supple.     Right lower leg: Edema present.     Left lower leg: Edema present.  Skin:    General: Skin is warm and dry.     Findings: No rash.  Neurological:     Mental Status: He is alert and oriented to person, place, and time.     GCS: GCS eye subscore is 4. GCS verbal subscore is 5. GCS motor subscore is 6.     Cranial Nerves: No cranial nerve deficit.     Sensory:  No sensory deficit.     Coordination: Coordination normal.  Psychiatric:        Speech: Speech normal.        Behavior: Behavior normal.        Thought Content: Thought content normal.     ED Results / Procedures / Treatments   Labs (all labs ordered are listed, but only abnormal results are displayed) Labs Reviewed  CBC WITH DIFFERENTIAL/PLATELET - Abnormal; Notable for the following components:      Result Value   Neutro Abs 8.3 (*)    All other components within normal limits  COMPREHENSIVE METABOLIC PANEL - Abnormal; Notable for the following components:   Sodium 132 (*)    Glucose, Bld 104 (*)    Total Protein 8.3 (*)    All other components within normal limits  BRAIN NATRIURETIC PEPTIDE - Abnormal; Notable for the following components:   B Natriuretic Peptide 112.7 (*)    All other components within normal limits  TROPONIN I (HIGH SENSITIVITY) - Abnormal; Notable for the following components:   Troponin I (High Sensitivity) 24 (*)    All other components within normal limits  SARS CORONAVIRUS 2 BY RT PCR (HOSPITAL ORDER, PERFORMED IN [facility])  TROPONIN I (HIGH SENSITIVITY)    EKG EKG Interpretation  Date/Time:  Friday April 14 2020 03:25:31 EDT Ventricular Rate:  105 PR Interval:    QRS Duration: 74 QT Interval:  345 QTC Calculation: 456 R Axis:   63 Text Interpretation: Sinus tachycardia LAE, consider biatrial enlargement Probable LVH with secondary repol abnrm Anteroseptal infarct, old Confirmed by Gilda Crease [phone]) on 04/14/2020 4:29:02 AM   Radiology DG Chest Port 1 View  Result Date: 04/14/2020 CLINICAL DATA:  Short of breath for 2 days EXAM: PORTABLE CHEST 1 VIEW COMPARISON:  02/10/2020 FINDINGS: Single frontal view of the chest demonstrates a stable cardiac silhouette. Continued ectasia of the thoracic aorta with mild atherosclerosis of the aortic arch. There is increased central vascular congestion, with basilar predominant  interstitial and ground-glass opacities most consistent with mild edema. Chronic scarring or atelectasis at the left base. No effusion or pneumothorax. No acute bony abnormalities. IMPRESSION: 1. Findings consistent with mild pulmonary edema. Electronically Signed   By: Sharlet Salina M.D.   On: 04/14/2020 03:51    Procedures Procedures (including critical care time)  Medications Ordered in ED Medications  hydrALAZINE (  APRESOLINE) tablet 100 mg (has no administration in time range)  isosorbide dinitrate (ISORDIL) tablet 30 mg (has no administration in time range)  furosemide (LASIX) injection 40 mg (40 mg Intravenous Given 04/14/20 0523)  hydrALAZINE (APRESOLINE) injection 20 mg (20 mg Intravenous Given 04/14/20 8502)    ED Course  I have reviewed the triage vital signs and the nursing notes.  Pertinent labs & imaging results that were available during my care of the patient were reviewed by me and considered in my medical decision making (see chart for details).    MDM Rules/Calculators/A&P                          Patient presents with progressive dyspnea on exertion over a period of 3 days.  He does have a history of hypertension and was hypertensive at arrival and blood pressure continued to increase during evaluation.  Patient exhibiting some wheezing and rails upon arrival with increased work of breathing.  Chest x-ray consistent with edema.  Blood pressure treated with hydralazine.  He is on hydralazine chronically.  He reports that he has been compliant with his medications.  Patient administered IV Lasix and has begun to diurese.  Patient will require hospitalization for further management.  Addendum: There was significant delay in obtaining BNP.  It is only slightly elevated at 112.  Still suspect that this is hypertensive urgency with congestive heart failure, but will add on CT angiography to rule out PE.  CRITICAL CARE Performed by: Gilda Crease   Total critical  care time: 35 minutes  Critical care time was exclusive of separately billable procedures and treating other patients.  Critical care was necessary to treat or prevent imminent or life-threatening deterioration.  Critical care was time spent personally by me on the following activities: development of treatment plan with patient and/or surrogate as well as nursing, discussions with consultants, evaluation of patient's response to treatment, examination of patient, obtaining history from patient or surrogate, ordering and performing treatments and interventions, ordering and review of laboratory studies, ordering and review of radiographic studies, pulse oximetry and re-evaluation of patient's condition.  Final Clinical Impression(s) / ED Diagnoses Final diagnoses:  Hypertensive urgency    Rx / DC Orders ED Discharge Orders    None       Wilton Thrall, Canary Brim, MD 04/14/20 [phone]

## 2020-04-15 LAB — BASIC METABOLIC PANEL
Anion gap: 9 (ref 5–15)
BUN: 16 mg/dL (ref 8–23)
CO2: 25 mmol/L (ref 22–32)
Calcium: 9.5 mg/dL (ref 8.9–10.3)
Chloride: 101 mmol/L (ref 98–111)
Creatinine, Ser: 1.2 mg/dL (ref 0.61–1.24)
GFR calc Af Amer: >60 mL/min (ref >60)
GFR calc non Af Amer: >60 mL/min (ref >60)
Glucose, Bld: 102 mg/dL — ABNORMAL HIGH (ref 70–99)
Potassium: 4.1 mmol/L (ref 3.5–5.1)
Sodium: 135 mmol/L (ref 135–145)

## 2020-04-15 LAB — CBC WITH DIFFERENTIAL/PLATELET
Abs Immature Granulocytes: 0.05 10*3/uL (ref 0.00–0.07)
Basophils Absolute: 0 10*3/uL (ref 0.0–0.1)
Basophils Relative: 0 %
Eosinophils Absolute: 0.1 10*3/uL (ref 0.0–0.5)
Eosinophils Relative: 1 %
HCT: 40.2 % (ref 39.0–52.0)
Hemoglobin: 13.3 g/dL (ref 13.0–17.0)
Immature Granulocytes: 1 %
Lymphocytes Relative: 12 %
Lymphs Abs: 1.2 10*3/uL (ref 0.7–4.0)
MCH: 30.6 pg (ref 26.0–34.0)
MCHC: 33.1 g/dL (ref 30.0–36.0)
MCV: 92.6 fL (ref 80.0–100.0)
Monocytes Absolute: 1.4 10*3/uL — ABNORMAL HIGH (ref 0.1–1.0)
Monocytes Relative: 15 %
Neutro Abs: 7.1 10*3/uL (ref 1.7–7.7)
Neutrophils Relative %: 71 %
Platelets: 272 10*3/uL (ref 150–400)
RBC: 4.34 MIL/uL (ref 4.22–5.81)
RDW: 13.6 % (ref 11.5–15.5)
WBC: 9.8 10*3/uL (ref 4.0–10.5)
nRBC: 0 % (ref 0.0–0.2)

## 2020-04-15 LAB — MAGNESIUM: Magnesium: 2.3 mg/dL (ref 1.7–2.4)

## 2020-04-15 MED ORDER — METOPROLOL TARTRATE 12.5 MG HALF TABLET
12.5000 mg | ORAL_TABLET | Freq: Two times a day (BID) | ORAL | Status: DC
  Administered 2020-04-15 – 2020-04-18 (×6): 12.5 mg via ORAL
  Filled 2020-04-15 (×6): qty 1

## 2020-04-15 NOTE — Progress Notes (Signed)
Subjective: Interviewed patient at bedside. Patient reports that he was having a lot of shortness of breath, he was off oxygen when we first saw him. He reports that his brother called him and wanted him to leave.  Discussed COPD with patient. He reported that he was going to quit smoking.  Discussed that it can take a while for him to respond to our treatment and that we would recommend him staying for treatment.  He reports that he would agree to a few days, he is calling his brother and told him he was going to stay for a few days.  Discussed the need to wear the cardiac monitor. That due to his long standing hypertension it was crucial to monitor his heart. He was agreeable to this and allowed the nurse to attach the monitor leads.   Objective:  Vital signs in last 24 hours: Vitals:   04/14/20 1638 04/14/20 2029 04/14/20 2052 04/14/20 2317  BP: (!) 184/99 (!) 168/95    Pulse: 98 89    Resp: (!) 24 20    Temp: 99 F (37.2 C) 98.2 F (36.8 C)    TempSrc:  Oral    SpO2: 100% 99% 98% 94%  Weight:      Height:       Physical Exam Vitals and nursing note reviewed.  Constitutional:      General: He is not in acute distress.    Appearance: He is well-developed. He is not ill-appearing or toxic-appearing.  HENT:     Head: Normocephalic and atraumatic.  Eyes:     Extraocular Movements: Extraocular movements intact.  Cardiovascular:     Rate and Rhythm: Normal rate and regular rhythm.     Pulses: Normal pulses.          Radial pulses are 2+ on the right side and 2+ on the left side.       Dorsalis pedis pulses are 2+ on the right side and 2+ on the left side.     Heart sounds: Normal heart sounds, S1 normal and S2 normal. No murmur heard.   Pulmonary:     Comments: Wheezing present through out all lung fields. Mild respiratory distress that improved after Duoneb treatment. Abdominal:     Palpations: Abdomen is soft. There is no mass.     Tenderness: There is no abdominal  tenderness.  Musculoskeletal:     Right lower leg: No edema.     Left lower leg: No edema.  Neurological:     General: No focal deficit present.     Mental Status: He is alert. Mental status is at baseline.      Assessment/Plan:  Principal Problem:   COPD exacerbation (HCC) Active Problems:   Dyspnea   Shea T. Keeling is a 64yo male seen by the [place] with PMH of hypertension and covid-19 (Feb 2021) presenting with shortness of breath which started yesterday   Presumed COPD Exacerbation: Initially thought possible new onset of HF, however troponins where flat for 24 hrs, legs looked dry even thought chest X did show some interstitial opacity. Therefore, COPD exacerbation given extensive smoking history seems more likely. CTA showed atelectasis and emphysema but no PE and no pulmonary edema. Echo showed- Left ventricular ejection fraction, by estimation, is 65  to 70%. The left ventricle has normal function. The left ventricle has no regional wall motion abnormalities and left ventricular diastolic parameters are consistent with Grade I diastolic dysfunction (impaired relaxation). Have started oral steroids and antibiotics  and duonebs. -Azithromycin 250 mg oral for 4 days (day 1) -Prednisone 40 mg daily -Duoneb 3 ml q4 PRN   Hypertensive Urgency: Long standing issue complicated by lack of insurance and health literacy. Have started medicines and have patient on cardiac telemetry. -Hydralazine 100 mg TID -Isordil 30 mg TID -Lopressor 12.5 mg daily -labetalol 10 mg IV q2 PRN systolic > 180 or diastolic >120    EtOH abuse: Reports drinking daily. CIWA without ativan -Monitor for withdrawal symptoms   Diet: heart healthy VTE: enoxaparin IVF: none   Prior to Admission Living Arrangement: homeless Anticipated Discharge Location: shelter Barriers to Discharge: response to steroids Dispo: Anticipated discharge in approximately 3-5 day(s).   Lauro Franklin, MD 04/15/2020,  6:58 AM Pager: [phone] After 5pm on weekdays and 1pm on weekends: On Call pager [phone]

## 2020-04-16 MED ORDER — IPRATROPIUM-ALBUTEROL 0.5-2.5 (3) MG/3ML IN SOLN
3.0000 mL | RESPIRATORY_TRACT | Status: DC | PRN
  Administered 2020-04-16 – 2020-04-18 (×7): 3 mL via RESPIRATORY_TRACT
  Filled 2020-04-16 (×7): qty 3

## 2020-04-16 NOTE — Progress Notes (Signed)
Subjective: Interviewed patient at bedside. Patient reports feeling a little better today. He was wondering when he could leave. Discussed that he is still on a lot of oxygen and needing treatments. He was agreeable to continuing to stay in the hospital and receive treatment.  Objective:  Vital signs in last 24 hours: Vitals:   04/15/20 1845 04/15/20 1944 04/16/20 0120 04/16/20 0432  BP:  (!) 183/96  (!) 155/101  Pulse:  90  93  Resp:  20  20  Temp:  98.1 F (36.7 C)  97.9 F (36.6 C)  TempSrc:  Oral  Oral  SpO2: 95% 95% 98% 98%  Weight:      Height:       Physical Exam Vitals and nursing note reviewed.  Constitutional:      General: He is not in acute distress.    Appearance: He is well-developed and normal weight. He is not ill-appearing or toxic-appearing.  HENT:     Head: Normocephalic and atraumatic.  Eyes:     Extraocular Movements: Extraocular movements intact.  Cardiovascular:     Rate and Rhythm: Normal rate and regular rhythm.     Pulses: Normal pulses.          Radial pulses are 2+ on the right side and 2+ on the left side.       Dorsalis pedis pulses are 2+ on the right side and 2+ on the left side.     Heart sounds: Normal heart sounds, S1 normal and S2 normal. No murmur heard.   Pulmonary:     Effort: Pulmonary effort is normal. No respiratory distress.     Breath sounds: Examination of the right-upper field reveals wheezing. Examination of the left-upper field reveals wheezing. Examination of the right-middle field reveals wheezing. Examination of the left-middle field reveals wheezing. Examination of the right-lower field reveals wheezing. Examination of the left-lower field reveals wheezing. Wheezing present.  Abdominal:     General: There is distension.     Palpations: Abdomen is soft. There is no mass.     Tenderness: There is no abdominal tenderness.  Musculoskeletal:     Right lower leg: No edema.     Left lower leg: No edema.  Neurological:      General: No focal deficit present.     Mental Status: He is alert. Mental status is at baseline.  Psychiatric:        Mood and Affect: Mood normal.        Behavior: Behavior normal.        Thought Content: Thought content normal.     Assessment/Plan:  Principal Problem:   COPD exacerbation (HCC) Active Problems:   Dyspnea   William Pruitt is a 64[facility] of [facility]and covid-19 (Feb 2021)presenting with shortness of breath which started yesterday   Presumed COPD Exacerbation: Initially thought possible new onset of HF, however troponins where flat for 24 hrs, legs looked dry even thought chest X did show some interstitial opacity. Therefore, COPD exacerbation given extensive smoking history seems more likely. CTA showed atelectasis and emphysema but no PE and no pulmonary edema. Echo showed- Left ventricular ejection fraction, by estimation, is 65  to 70%. The left ventricle has normal function. The left ventricle has no regional wall motion abnormalities and left ventricular diastolic parameters are consistent with Grade I diastolic dysfunction (impaired relaxation). Started oral steroids and antibiotics and duonebs yesterday. -Continue azithromycin 250 mg oral for 4 days (day  2) -Continue prednisone 40 mg daily -Continue duoneb 3 ml q4 PRN   Hypertensive Urgency: Long standing issue complicated by lack of insurance and health literacy. Started medicines yesterday and patient is on cardiac telemetry. Pressures were still high so Lopressor changed to 12.5 mg BID. -Hydralazine 100 mg TID -Isordil 30 mg TID -Lopressor 12.5 mg BID -labetalol 10 mg IV q2 PRN systolic > 180 or diastolic >120    EtOH abuse: Reports drinking daily. CIWA without ativan. No symptoms of withdrawal yet. -Monitor for withdrawal symptoms   Diet:heart healthy [id number] KVQ:[id number]   Prior to Admission Living Arrangement: homeless Anticipated Discharge  Location: shelter Barriers to Discharge: response to steroids Dispo: Anticipated discharge in approximately 3-5 day(s).   Lauro Franklin, MD 04/16/2020, 5:56 AM Pager: [phone] After 5pm on weekdays and 1pm on weekends: On Call pager [phone]

## 2020-04-17 DIAGNOSIS — I10 Essential (primary) hypertension: Secondary | ICD-10-CM

## 2020-04-17 LAB — RESPIRATORY PANEL BY RT PCR (FLU A&B, COVID)
Influenza A by PCR: NEGATIVE
Influenza B by PCR: NEGATIVE
SARS Coronavirus 2 by RT PCR: NEGATIVE

## 2020-04-17 MED ORDER — LOSARTAN POTASSIUM 25 MG PO TABS
25.0000 mg | ORAL_TABLET | Freq: Every day | ORAL | Status: DC
  Administered 2020-04-17 – 2020-04-18 (×2): 25 mg via ORAL
  Filled 2020-04-17 (×2): qty 1

## 2020-04-17 NOTE — Plan of Care (Signed)
  Problem: Clinical Measurements: Goal: Ability to maintain clinical measurements within normal limits will improve Outcome: Progressing Goal: Will remain free from infection Outcome: Progressing Goal: Diagnostic test results will improve Outcome: Progressing Goal: Respiratory complications will improve Outcome: Progressing Goal: Cardiovascular complication will be avoided Outcome: Progressing   Problem: Nutrition: Goal: Adequate nutrition will be maintained Outcome: Progressing   Problem: Activity: Goal: Risk for activity intolerance will decrease Outcome: Progressing   Problem: Elimination: Goal: Will not experience complications related to bowel motility Outcome: Progressing Goal: Will not experience complications related to urinary retention Outcome: Progressing   Problem: Pain Managment: Goal: General experience of comfort will improve Outcome: Progressing   Problem: Education: Goal: Knowledge of disease or condition will improve Outcome: Progressing Goal: Knowledge of the prescribed therapeutic regimen will improve Outcome: Progressing Goal: Individualized Educational Video(s) Outcome: Progressing

## 2020-04-17 NOTE — Progress Notes (Signed)
SATURATION QUALIFICATIONS:   Patient Saturations on Room Air at Rest = 100%  Patient Saturations on Room Air while Ambulating = 95%  Pt remained at 95 or above , however he insisted he could not breathe and needed the O2. Started breathing heavy & said he just needed to sit down

## 2020-04-17 NOTE — Progress Notes (Signed)
Patient c/o shortness of breath after transferring from the chair to bed.  Assessment showed audible wheezing, 95% o2 sat, and non productive cough. Patient given PRN nebulizer treatment. Patient is non-compliant with fluid restriction. Patient requesting ice and drinks from any staff entering the room. Rn recorded 960 intake within a 3 hour span. Patient educated on fluid restriction and importance of keeping fluid intake to as ordered. Orders written in patients view for additional visual cues and reminders.

## 2020-04-17 NOTE — TOC Initial Note (Signed)
Transition of Care [facility]) - Initial/Assessment Note    Patient Details  Name: William Pruitt MRN: [id number] Date of Birth: Apr 09, 1956  Transition of Care [facility]) CM/SW Contact:    Pollie Friar, RN Phone Number: 04/17/2020, 2:13 PM  Clinical Narrative:                 CM met with the patient and he stays at [facility]. Pt will need transportation back to the shelter once d/ced.  Pt goes to [facility] for PCP. They also provide him with his medications via mail delivery.  Pt uses the bus or [place] provides transportation to his appts.  TOC following for further d/c needs.   Expected Discharge Plan: Homeless Shelter Barriers to Discharge: Continued Medical Work up   Patient Goals and CMS Choice        Expected Discharge Plan and Services Expected Discharge Plan: [place]   Discharge Planning Services: CM Consult   Living arrangements for the past 2 months: Homeless Shelter                                      Prior Living Arrangements/Services Living arrangements for the past 2 months: Homeless Shelter Lives with:: Other (Comment) Patient language and need for interpreter reviewed:: Yes Do you feel safe going back to the place where you live?: Yes      Need for Family Participation in Patient Care: No (Comment) Care giver support system in place?: No (comment)   Criminal Activity/Legal Involvement Pertinent to Current Situation/Hospitalization: No - Comment as needed  Activities of Daily Living Home Assistive Devices/Equipment: None ADL Screening (condition at time of admission) Patient's cognitive ability adequate to safely complete daily activities?: Yes Is the patient deaf or have difficulty hearing?: No Does the patient have difficulty seeing, even when wearing glasses/contacts?: No Does the patient have difficulty concentrating, remembering, or making decisions?: Yes Patient able to express need for assistance with ADLs?:  No Does the patient have difficulty dressing or bathing?: No Independently performs ADLs?: Yes (appropriate for developmental age) Does the patient have difficulty walking or climbing stairs?: Yes Weakness of Legs: None Weakness of Arms/Hands: None  Permission Sought/Granted                  Emotional Assessment Appearance:: Appears stated age Attitude/Demeanor/Rapport: Engaged Affect (typically observed): Accepting Orientation: : Oriented to Self, Oriented to Place, Oriented to  Time, Oriented to Situation   Psych Involvement: No (comment)  Admission diagnosis:  Dyspnea [R06.00] Hypertensive urgency [I16.0] Patient Active Problem List   Diagnosis Date Noted  . Uncontrolled hypertension   . Dyspnea 04/14/2020  . Hypertensive urgency   . COPD exacerbation ([place])   . ETOH abuse 11/14/2019  . Pneumonia due to COVID-19 virus 11/14/2019   PCP:  Patient, No Pcp Per Pharmacy:   [place], [place] - [address] [place] [phone] [place] [address] Phone: [phone] Fax: [phone]     Social Determinants of Health (SDOH) Interventions    Readmission Risk Interventions No flowsheet data found.

## 2020-04-17 NOTE — Progress Notes (Addendum)
Subjective: Interviewed patient at bedside. He reports doing better today. He states his breathing has improved.    Objective:  Vital signs in last 24 hours: Vitals:   04/16/20 0432 04/16/20 0758 04/16/20 1552 04/16/20 2048  BP: (!) 155/101 (!) 164/104 (!) 163/89 (!) 176/98  Pulse: 93 98 91 90  Resp: 20   20  Temp: 97.9 F (36.6 C) 97.8 F (36.6 C) 98 F (36.7 C) 97.8 F (36.6 C)  TempSrc: Oral     SpO2: 98% 96% 100% 99%  Weight:      Height:       Physical Exam Vitals and nursing note reviewed.  Constitutional:      General: He is not in acute distress.    Appearance: He is well-developed. He is not ill-appearing or toxic-appearing.  HENT:     Head: Normocephalic and atraumatic.  Eyes:     Extraocular Movements: Extraocular movements intact.  Cardiovascular:     Rate and Rhythm: Normal rate and regular rhythm.     Pulses: Normal pulses.          Radial pulses are 2+ on the right side and 2+ on the left side.       Dorsalis pedis pulses are 2+ on the right side and 2+ on the left side.     Heart sounds: Normal heart sounds, S1 normal and S2 normal. No murmur heard.   Pulmonary:     Effort: Pulmonary effort is normal. No respiratory distress.     Breath sounds: Examination of the right-upper field reveals wheezing. Examination of the left-upper field reveals wheezing. Examination of the right-middle field reveals wheezing. Examination of the left-middle field reveals wheezing. Examination of the right-lower field reveals wheezing. Examination of the left-lower field reveals wheezing. Wheezing present.  Abdominal:     General: There is distension.     Palpations: Abdomen is soft. There is no mass.     Tenderness: There is no abdominal tenderness.  Musculoskeletal:     Right lower leg: No edema.     Left lower leg: No edema.  Neurological:     Mental Status: He is alert. Mental status is at baseline.  Psychiatric:        Mood and Affect: Mood normal.         Behavior: Behavior normal.        Thought Content: Thought content normal.      Assessment/Plan:  Principal Problem:   COPD exacerbation (HCC) Active Problems:   Dyspnea  Gad T. Ice is a 64yo male seen by the [place] with PMH of hypertension and covid-19 (Feb 2021) presenting with shortness of breath which started yesterday     Presumed COPD Exacerbation: No PTFs or prior diagnosis but given his extensive smoking history (51 pack year) and emphysematous changes on CT, underlying COPD is highly likely. He is still requiring 4L [place] and has persistent wheezing / rales on exam today.  -Continue azithromycin 250 mg oral day 4 / 5 -Continue prednisone 40 mg daily  -Continue scheduled duoneb 3 ml q4 PRN -Wean oxygen as able; ambulatory pulse ox today      Hypertensive Urgency - Resolved Chronic Hypertension: Remains uncontrolled despite restarting home medications and increased lopressor.  - Start Losartan 25 mg Continue:  -Hydralazine 100 mg TID -Isordil 30 mg TID -Lopressor 12.5 mg BID -labetalol 10 mg IV q2 PRN systolic > 180 or diastolic >120      EtOH abuse: Reports drinking daily. CIWA without  ativan. No symptoms of withdrawal yet. -Continue to monitor for withdrawal symptoms     Diet: heart healthy VTE: enoxaparin IVF: none     Prior to Admission Living Arrangement: homeless Anticipated Discharge Location: shelter Barriers to Discharge: response to steroids Dispo: Anticipated discharge in approximately 1-2 day(s).   Lauro Franklin, MD 04/17/2020, 5:41 AM Pager: [phone] After 5pm on weekdays and 1pm on weekends: On Call pager [phone]

## 2020-04-18 MED ORDER — ALBUTEROL SULFATE HFA 108 (90 BASE) MCG/ACT IN AERS
2.0000 | INHALATION_SPRAY | Freq: Four times a day (QID) | RESPIRATORY_TRACT | 0 refills | Status: DC | PRN
Start: 2020-04-18 — End: 2020-06-21

## 2020-04-18 MED ORDER — UMECLIDINIUM BROMIDE 62.5 MCG/INH IN AEPB
1.0000 | INHALATION_SPRAY | Freq: Every day | RESPIRATORY_TRACT | Status: DC
  Filled 2020-04-18: qty 7

## 2020-04-18 MED ORDER — UMECLIDINIUM BROMIDE 62.5 MCG/INH IN AEPB: 1.0000 | INHALATION_SPRAY | Freq: Every day | RESPIRATORY_TRACT | 0 refills | Status: AC

## 2020-04-18 MED ORDER — LOSARTAN POTASSIUM 25 MG PO TABS: 25.0000 mg | ORAL_TABLET | Freq: Every day | ORAL | 0 refills | Status: DC

## 2020-04-18 MED ORDER — TIOTROPIUM BROMIDE MONOHYDRATE 18 MCG IN CAPS: 18.0000 ug | ORAL_CAPSULE | Freq: Every day | RESPIRATORY_TRACT | Status: DC

## 2020-04-18 MED FILL — ALBUTEROL SULFATE HFA 108 (: 108 (90 BAS | 25 days supply | Qty: 18 | Fill #0

## 2020-04-18 MED FILL — INCRUSE ELLIPTA 62.5 MCG IN: 62.5 | 30 days supply | Qty: 30 | Fill #0

## 2020-04-18 MED FILL — LOSARTAN POTASSIUM 25 MG TA: 25 | 30 days supply | Qty: 30 | Fill #0

## 2020-04-18 NOTE — Plan of Care (Signed)

## 2020-04-18 NOTE — Progress Notes (Addendum)
Subjective: Interviewed patient at bedside. He reports breathing a little better today. He reports no new pain. We discussed the new medication he will be started on for COPD. He has no other questions at this time.  Objective:  Vital signs in last 24 hours: Vitals:   04/17/20 0748 04/17/20 1625 04/17/20 1628 04/17/20 2253  BP: (!) 172/87 (!) 163/103  (!) 155/98  Pulse: 101 89  85  Resp:      Temp: 97.9 F (36.6 C) 98.3 F (36.8 C)  97.9 F (36.6 C)  TempSrc: Oral Oral    SpO2: 96% 93% 92% 99%  Weight:      Height:       Physical Exam Vitals and nursing note reviewed.  HENT:     Head: Normocephalic and atraumatic.  Eyes:     Extraocular Movements: Extraocular movements intact.  Cardiovascular:     Rate and Rhythm: Normal rate and regular rhythm.     Pulses: Normal pulses.          Radial pulses are 2+ on the right side and 2+ on the left side.       Dorsalis pedis pulses are 2+ on the right side and 2+ on the left side.     Heart sounds: Normal heart sounds, S1 normal and S2 normal. No murmur heard.   Pulmonary:     Effort: Pulmonary effort is normal. No respiratory distress.     Breath sounds: Examination of the right-upper field reveals wheezing. Examination of the left-upper field reveals wheezing. Wheezing present.  Abdominal:     General: There is distension.     Palpations: Abdomen is soft. There is no mass.     Tenderness: There is no abdominal tenderness.  Musculoskeletal:     Right lower leg: No edema.     Left lower leg: No edema.  Neurological:     General: No focal deficit present.     Mental Status: He is alert. Mental status is at baseline.  Psychiatric:        Mood and Affect: Mood normal.        Behavior: Behavior normal.        Thought Content: Thought content normal.      Assessment/Plan:  Principal Problem:   COPD exacerbation (HCC) Active Problems:   Dyspnea   Uncontrolled hypertension   William Pruitt is a 64yo male seen by the  [place] with PMH of hypertension and covid-19 (Feb 2021) presenting with acute exacerbation of presumed COPD and Hypertensive urgency (resolved).     Presumed COPD Exacerbation:  Symptoms have improved with treatment unfortunately has persistent oxygen requirement. Ambulatory pulse ox was not able to keep sats above 88% on room air. When repeated required 3 L/min to keep sat above 88%.   -Today will finish azithromycin 250 mg oral day 5 / 5 -Today will finsih prednisone 40 mg day 5 / 5 -Continue scheduled duoneb 3 ml q4 PRN -Will start Incruse Ellipta 1 puff daily & Albuterol inhaler PRN on discharge  -Will discharge on 3 L/min home oxygen    Hypertensive Urgency - Resolved Chronic Hypertension: Still high but better controlled with addition of Losartan. Will need close outpatient follow up. Consider work up for resistant hypertension.  Continue:  -Hydralazine 100 mg TID -Isordil 30 mg TID -Lopressor 12.5 mg BID -labetalol 10 mg IV q2 PRN systolic > 180 or diastolic >120  -Losartan 25 mg    EtOH abuse: Reports drinking daily. CIWA without  ativan. No symptoms of withdrawal yet. -Continue to monitor for withdrawal symptoms     Diet: heart healthy VTE: enoxaparin IVF: none     Prior to Admission Living Arrangement: homeless Anticipated Discharge Location: shelter Barriers to Discharge: Getting home Oxygen Dispo: Anticipated discharge today.    Lauro Franklin, MD 04/18/2020, 6:00 AM Pager: [phone] After 5pm on weekdays and 1pm on weekends: On Call pager [phone]

## 2020-04-18 NOTE — Care Management (Addendum)
Please send all new scripts to [facility] pharmacy at [place].  15:00 Meds filled by [facility] pharmacy, paid for through petty cash by this CM.  They are on hold in [facility] pharmacy until [place].   Taxi voucher provided to bedside nurse for use if patient does not need home oxygen.

## 2020-04-18 NOTE — Care Management Important Message (Signed)
Important Message  Patient Details  Name: William Pruitt MRN: [id number] Date of Birth: 02-12-1956   Medicare Important Message Given:  Yes     Dorena Bodo 04/18/2020, 3:37 PM

## 2020-04-18 NOTE — Care Management (Addendum)
Notified [employer] of home oxygen order.

## 2020-04-18 NOTE — Progress Notes (Signed)
[facility], left via taxi cab. AVS documentation went over, meds received from pharmacy. Pt discharged on 3LPM O2. Oxygen consecrator and extra tank sent with pt.

## 2020-04-18 NOTE — Progress Notes (Addendum)
SATURATION QUALIFICATIONS: (This note is used to comply with regulatory documentation for home oxygen)  Patient Saturations on Room Air at Rest = 89-91%  Patient Saturations on Room Air while Ambulating = 86%  Patient Saturations on 3 Liters of oxygen while Ambulating = 88%    Please briefly explain why patient needs home oxygen: Pt unable to maintain oxygen saturations above 88% wile on room air.

## 2020-04-18 NOTE — Discharge Summary (Addendum)
Name: William Pruitt MRN: [id number] DOB: 25-Jun-1956 64 y.o. PCP: Patient, No Pcp Per  Date of Admission: 04/14/2020  3:18 AM Date of Discharge: 04/18/2020 Attending Physician: Reymundo Poll, MD  Discharge Diagnosis: 1. Presumed exacerbation of COPD 2. Uncontrolled Hypertension  Discharge Medications: Allergies as of 04/18/2020   No Known Allergies      Medication List     TAKE these medications    acetaminophen 325 MG tablet Commonly known as: TYLENOL Take 2 tablets (650 mg total) by mouth every 6 (six) hours as needed for mild pain or headache (fever >/= 101).   albuterol 108 (90 Base) MCG/ACT inhaler Commonly known as: VENTOLIN HFA Inhale 2 puffs into the lungs every 6 (six) hours as needed for wheezing or shortness of breath.   hydrALAZINE 100 MG tablet Commonly known as: APRESOLINE Take 1 tablet (100 mg total) by mouth every 8 (eight) hours.   isosorbide dinitrate 30 MG tablet Commonly known as: ISORDIL Take 1 tablet (30 mg total) by mouth 3 (three) times daily.   losartan 25 MG tablet Commonly known as: COZAAR Take 1 tablet (25 mg total) by mouth daily. Start taking on: April 19, 2020   metoprolol tartrate 25 MG tablet Commonly known as: LOPRESSOR Take 0.5 tablets (12.5 mg total) by mouth 2 (two) times daily.   umeclidinium bromide 62.5 MCG/INH Aepb Commonly known as: INCRUSE ELLIPTA Inhale 1 puff into the lungs daily.        Disposition and follow-up:   William Pruitt was discharged from [facility] in Stable condition.  At the hospital follow up visit please address:  1. Acute COPD exacerbation: No prior diagnosis but 51 pack year smoking history with emphysematous changes seen on CT. Completed 5 day course of prednisone and azithromycin with improvement in symptoms, but had a persistent oxygen requirement. Was discharged on 3L [place] - please follow up and wean as able. Was also started on daily Incruse for maintenance treatment and  albuterol PRN. Recommend outpatient PFTs.    Uncontrolled Hypertension: Presented in hypertensive urgency with SBP > 200, non compliant with medications. During his stay we resumed his home hydralazine 100 mg TID and Isordil 30 mg TID. Lopressor 12.5 was increased from daily -> BID. Losartan 25 mg daily was also added to his regimen. BP improved but he remained hypertensive. Please follow up.    2.  Labs / imaging needed at time of follow-up: PFTs, BMP  3.  Pending labs/ test needing follow-up: none  Follow-up Appointments:  [facility]  Hospital Course by problem list:  1. Acute COPD exacerbation: Presented with SOB found to have diffuse wheezing and hypoxia on admission. CT chest showed emphysematous changes. In the setting of his extensive smoking history (51 pack years), he was treated empirically for COPD exacerbation with a 5 day course of prednisone and azithromycin. Symptoms improved unfortunately he continued to require oxygen. He was weaned to 3L [place] and discharged on home oxygen therapy. He was also started on daily Incruse for maintenance treatment and albuterol inhaler for rescue therapy. Recommend smoking cessation, outpatient PFTs.   Hypertensive Urgency (resolved) Uncontrolled Hypertension: On admission blood pressure was > 200 systolic. He was asymptomatic and had no evidence of end organ damage (normal troponins, no renal dysfunction). Echocardiogram was unremarkable with normal EF. He was treated with IV hydralazine and IV labetolol in the ED. He admitted to taking his medications only once a day despite hydralazine and  imdur being TID. Also on review of his prescription bottles, they were filled 3-4 months ago for a 30 day supply with multiple pills left bottle. He was restarted on all home medications: Hydralazine 100 mg TID, Isordil 30 mg TID, Lopressor 12.5 daily, but BP remained uncontrolled. On 7/25 his Lopressor was increased to BID dosing. We also  added Losartan 25 mg daily. Blood pressures improved to 160s systolic but remained uncontrolled. Recommend further titration of regimen as an outpatient. Consider changing to HCTZ and or amlodipine to reduce frequency of dosing to improve compliance.    Discharge Vitals:   BP (!) 163/71 (BP Location: Right Arm)   Pulse 98   Temp 98 F (36.7 C)   Resp 19   Ht 6\' 2"  (1.88 m)   Wt (!) 112.2 kg   SpO2 (!) 85%   BMI 31.76 kg/m   Pertinent Labs, Studies, and Procedures:  BMP Latest Ref Rng & Units 04/15/2020 04/14/2020 02/10/2020  Glucose 70 - 99 mg/dL 02/12/2020) 099(I) 85  BUN 8 - 23 mg/dL 16 10 14   Creatinine 0.61 - 1.24 mg/dL 338(S 5.05  Sodium 135 - 145 mmol/L 135 132(L) 133(L)  Potassium 3.5 - 5.1 mmol/L 4.1 4.5 4.0  Chloride 98 - 111 mmol/L 101 98 100  CO2 22 - 32 mmol/L 25 23 20(L)  Calcium 8.9 - 10.3 mg/dL 9.5 9.5 9.0   CBC Latest Ref Rng & Units 04/15/2020 04/14/2020 02/10/2020  WBC 4.0 - 10.5 K/uL 9.8 10.2 8.2  Hemoglobin 13.0 - 17.0 g/dL 04/16/2020 02/12/2020 12.5(L)  Hematocrit 39 - 52 % 40.2 42.4 39.0  Platelets 150 - 400 K/uL 272 266 279   Chest X: Findings consistent with mild pulmonary edema.  CT Angio Chest PE W or WO Contrast: Technically borderline study but no definite or large/centralpulmonary emboli identified. Subsegmental atelectasis within both LOWER lungs, LEFT-greater-than-RIGHT. Mild cardiomegaly and coronary artery disease.  Discharge Instructions: Discharge Instructions     Call MD for:   Complete by: As directed    Call MD for:  difficulty breathing, headache or visual disturbances   Complete by: As directed    Call MD for:  extreme fatigue   Complete by: As directed    Call MD for:  hives   Complete by: As directed    Call MD for:  persistant dizziness or light-headedness   Complete by: As directed    Call MD for:  persistant nausea and vomiting   Complete by: As directed    Call MD for:  redness, tenderness, or signs of infection (pain, swelling, redness,  odor or green/yellow discharge around incision site)   Complete by: As directed    Call MD for:  severe uncontrolled pain   Complete by: As directed    Call MD for:  temperature >100.4   Complete by: As directed    Diet - low sodium heart healthy   Complete by: As directed    Discharge instructions   Complete by: As directed    Dear Mr. Renovato, It was a pleasure taking care of you. You were admitted for an exacerbation of presumed COPD and for uncontrolled blood pressure. We have started you on a new inhaler Incruse Ellipta that you should take one puff of daily. For your blood pressure we have started a new blood pressure to take as well as continuing all other ones. Please now take losartan one pill daily as well as all of your previously prescribed blood pressure medications. [facility] to discuss these new changes and to try to arrange formal testing for COPD.  Thank you   Increase activity slowly   Complete by: As directed        Signed: Lauro Franklin, MD 04/18/2020, 2:33 PM   Pager: [phone]

## 2020-04-19 ENCOUNTER — Encounter (HOSPITAL_COMMUNITY): Payer: Self-pay | Admitting: *Deleted

## 2020-04-19 ENCOUNTER — Emergency Department (HOSPITAL_COMMUNITY)
Admission: EM | Admit: 2020-04-19 | Discharge: 2020-04-19 | Disposition: A | Discharge status: Home / Self Care | Payer: Medicare Other | Attending: Emergency Medicine | Admitting: Emergency Medicine

## 2020-04-19 ENCOUNTER — Emergency Department (HOSPITAL_COMMUNITY): Payer: Medicare Other

## 2020-04-19 ENCOUNTER — Other Ambulatory Visit: Payer: Self-pay

## 2020-04-19 DIAGNOSIS — J449 Chronic obstructive pulmonary disease, unspecified: Secondary | ICD-10-CM | POA: Insufficient documentation

## 2020-04-19 DIAGNOSIS — Z7951 Long term (current) use of inhaled steroids: Secondary | ICD-10-CM | POA: Insufficient documentation

## 2020-04-19 DIAGNOSIS — Z79899 Other long term (current) drug therapy: Secondary | ICD-10-CM | POA: Insufficient documentation

## 2020-04-19 DIAGNOSIS — R0602 Shortness of breath: Secondary | ICD-10-CM | POA: Diagnosis present

## 2020-04-19 DIAGNOSIS — Z20822 Contact with and (suspected) exposure to covid-19: Secondary | ICD-10-CM | POA: Diagnosis not present

## 2020-04-19 DIAGNOSIS — F17210 Nicotine dependence, cigarettes, uncomplicated: Secondary | ICD-10-CM | POA: Insufficient documentation

## 2020-04-19 DIAGNOSIS — I10 Essential (primary) hypertension: Secondary | ICD-10-CM | POA: Insufficient documentation

## 2020-04-19 LAB — CBC
HCT: 40 % (ref 39.0–52.0)
Hemoglobin: 12.7 g/dL — ABNORMAL LOW (ref 13.0–17.0)
MCH: 29.8 pg (ref 26.0–34.0)
MCHC: 31.8 g/dL (ref 30.0–36.0)
MCV: 93.9 fL (ref 80.0–100.0)
Platelets: 310 10*3/uL (ref 150–400)
RBC: 4.26 MIL/uL (ref 4.22–5.81)
RDW: 13 % (ref 11.5–15.5)
WBC: 8.3 10*3/uL (ref 4.0–10.5)
nRBC: 0 % (ref 0.0–0.2)

## 2020-04-19 LAB — BASIC METABOLIC PANEL
Anion gap: 9 (ref 5–15)
BUN: 14 mg/dL (ref 8–23)
CO2: 26 mmol/L (ref 22–32)
Calcium: 9 mg/dL (ref 8.9–10.3)
Chloride: 92 mmol/L — ABNORMAL LOW (ref 98–111)
Creatinine, Ser: 0.99 mg/dL (ref 0.61–1.24)
GFR calc Af Amer: >60 mL/min (ref >60)
GFR calc non Af Amer: >60 mL/min (ref >60)
Glucose, Bld: 87 mg/dL (ref 70–99)
Potassium: 3.6 mmol/L (ref 3.5–5.1)
Sodium: 127 mmol/L — ABNORMAL LOW (ref 135–145)

## 2020-04-19 LAB — BRAIN NATRIURETIC PEPTIDE: B Natriuretic Peptide: 131.5 pg/mL — ABNORMAL HIGH (ref 0.0–100.0)

## 2020-04-19 LAB — SARS CORONAVIRUS 2 BY RT PCR (HOSPITAL ORDER, PERFORMED IN ~~LOC~~ HOSPITAL LAB): SARS Coronavirus 2: NEGATIVE

## 2020-04-19 MED ORDER — IPRATROPIUM BROMIDE HFA 17 MCG/ACT IN AERS
2.0000 | INHALATION_SPRAY | Freq: Once | RESPIRATORY_TRACT | Status: AC
  Administered 2020-04-19: 2 via RESPIRATORY_TRACT
  Filled 2020-04-19: qty 12.9

## 2020-04-19 MED ORDER — METOPROLOL TARTRATE 25 MG PO TABS
12.5000 mg | ORAL_TABLET | Freq: Once | ORAL | Status: AC
  Administered 2020-04-19: 12.5 mg via ORAL
  Filled 2020-04-19: qty 1

## 2020-04-19 MED ORDER — ISOSORBIDE DINITRATE 30 MG PO TABS
30.0000 mg | ORAL_TABLET | Freq: Once | ORAL | Status: AC
  Administered 2020-04-19: 30 mg via ORAL
  Filled 2020-04-19: qty 1

## 2020-04-19 MED ORDER — PREDNISONE 10 MG (21) PO TBPK
ORAL_TABLET | ORAL | 0 refills | Status: DC
Start: 2020-04-19 — End: 2020-05-29

## 2020-04-19 MED ORDER — SODIUM CHLORIDE 0.9% FLUSH
3.0000 mL | Freq: Once | INTRAVENOUS | Status: AC
  Administered 2020-04-19: 3 mL via INTRAVENOUS

## 2020-04-19 MED ORDER — LOSARTAN POTASSIUM 50 MG PO TABS
25.0000 mg | ORAL_TABLET | Freq: Once | ORAL | Status: AC
  Administered 2020-04-19: 25 mg via ORAL
  Filled 2020-04-19: qty 1

## 2020-04-19 MED ORDER — HYDRALAZINE HCL 25 MG PO TABS
100.0000 mg | ORAL_TABLET | Freq: Once | ORAL | Status: AC
  Administered 2020-04-19: 100 mg via ORAL
  Filled 2020-04-19: qty 4

## 2020-04-19 MED ORDER — ALBUTEROL SULFATE HFA 108 (90 BASE) MCG/ACT IN AERS
4.0000 | INHALATION_SPRAY | Freq: Once | RESPIRATORY_TRACT | Status: AC
  Administered 2020-04-19: 4 via RESPIRATORY_TRACT
  Filled 2020-04-19: qty 6.7

## 2020-04-19 MED ORDER — METHYLPREDNISOLONE SODIUM SUCC 125 MG IJ SOLR
125.0000 mg | Freq: Once | INTRAMUSCULAR | Status: AC
  Administered 2020-04-19: 125 mg via INTRAVENOUS
  Filled 2020-04-19: qty 2

## 2020-04-19 NOTE — ED Provider Notes (Signed)
[facility] EMERGENCY DEPARTMENT Provider Note   CSN: [id number] Arrival date & time: 04/19/20  0840     History Chief Complaint  Patient presents with  . Shortness of Breath    William Pruitt is a 64 y.o. male.  HPI      William Pruitt is a 64 y.o. male, with a history of EtOH abuse, HTN, COPD, presenting to the ED with shortness of breath beginning this morning. Patient states he was wearing his oxygen overnight and was not short of breath while wearing his oxygen.   He took his oxygen off and started to move around, namely to go outside to smoke a cigarette, which made him short of breath.  He states he used his albuterol inhaler, which temporarily improved symptoms. Patient smokes 1 pack cigarettes per day.  Drinks 3-4, 12oz beers daily, last alcohol yesterday evening.  Patient states he was discharged from the hospital yesterday with 3 L supplemental O2 added to his daily regimen. Denies fever/chills, increased cough, chest pain, abdominal pain, syncope, lower extremity edema, or any other complaints.  Past Medical History:  Diagnosis Date  . H/O ETOH abuse   . Hypertension     Patient Active Problem List   Diagnosis Date Noted  . Uncontrolled hypertension   . Dyspnea 04/14/2020  . Hypertensive urgency   . COPD exacerbation (HCC)   . ETOH abuse 11/14/2019  . Pneumonia due to COVID-19 virus 11/14/2019    History reviewed. No pertinent surgical history.     History reviewed. No pertinent family history.  Social History   Tobacco Use  . Smoking status: Current Every Day Smoker    Packs/day: 1.00  . Smokeless tobacco: Never Used  Substance Use Topics  . Alcohol use: Yes    Comment: 4 beers per day  . Drug use: Not Currently    Home Medications Prior to Admission medications   Medication Sig Start Date End Date Taking? Authorizing Provider  acetaminophen (TYLENOL) 325 MG tablet Take 2 tablets (650 mg total) by mouth every 6 (six) hours  as needed for mild pain or headache (fever >/= 101). 11/21/19  Yes Lonia Blood, MD  albuterol (VENTOLIN HFA) 108 (90 Base) MCG/ACT inhaler Inhale 2 puffs into the lungs every 6 (six) hours as needed for wheezing or shortness of breath. 04/18/20  Yes Pashayan, Mardelle Matte, MD  hydrALAZINE (APRESOLINE) 100 MG tablet Take 1 tablet (100 mg total) by mouth every 8 (eight) hours. 11/21/19  Yes Lonia Blood, MD  isosorbide dinitrate (ISORDIL) 30 MG tablet Take 1 tablet (30 mg total) by mouth 3 (three) times daily. 11/21/19  Yes Lonia Blood, MD  losartan (COZAAR) 25 MG tablet Take 1 tablet (25 mg total) by mouth daily. 04/19/20  Yes Pashayan, Mardelle Matte, MD  metoprolol tartrate (LOPRESSOR) 25 MG tablet Take 0.5 tablets (12.5 mg total) by mouth 2 (two) times daily. 11/21/19  Yes Lonia Blood, MD  umeclidinium bromide (INCRUSE ELLIPTA) 62.5 MCG/INH AEPB Inhale 1 puff into the lungs daily. 04/18/20  Yes Pashayan, Mardelle Matte, MD  predniSONE (STERAPRED UNI-PAK 21 TAB) 10 MG (21) TBPK tablet Take 6 tabs (60mg ) day 1, 5 tabs (50mg ) day 2, 4 tabs (40mg ) day 3, 3 tabs (30mg ) day 4, 2 tabs (20mg ) day 5, and 1 tab (10mg ) day 6. 04/19/20   Lee-Anne Flicker C, PA-C    Allergies    Patient has no known allergies.  Review of Systems   Review of  Systems  Constitutional: Negative for chills, diaphoresis and fever.  Respiratory: Positive for shortness of breath. Negative for cough.   Cardiovascular: Negative for chest pain and leg swelling.  Gastrointestinal: Negative for abdominal pain, diarrhea, nausea and vomiting.  Neurological: Negative for syncope, weakness and numbness.  All other systems reviewed and are negative.   Physical Exam Updated Vital Signs BP (!) 176/113 (BP Location: Left Arm)   Pulse 84   Temp 97.8 F (36.6 C) (Oral)   Resp 18   SpO2 97%   Physical Exam Vitals and nursing note reviewed.  Constitutional:      General: He is not in acute distress.    Appearance: He is  well-developed. He is not diaphoretic.  HENT:     Head: Normocephalic and atraumatic.     Mouth/Throat:     Mouth: Mucous membranes are moist.     Pharynx: Oropharynx is clear.  Eyes:     Conjunctiva/sclera: Conjunctivae normal.  Cardiovascular:     Rate and Rhythm: Normal rate and regular rhythm.     Pulses: Normal pulses.          Radial pulses are 2+ on the right side and 2+ on the left side.       Posterior tibial pulses are 2+ on the right side and 2+ on the left side.     Heart sounds: Normal heart sounds.     Comments: Tactile temperature in the extremities appropriate and equal bilaterally. Pulmonary:     Effort: Tachypnea present.     Breath sounds: Wheezing present.     Comments: Increased work of breathing. Abdominal:     Palpations: Abdomen is soft.     Tenderness: There is no abdominal tenderness. There is no guarding.  Musculoskeletal:     Cervical back: Neck supple.     Right lower leg: No edema.     Left lower leg: No edema.  Lymphadenopathy:     Cervical: No cervical adenopathy.  Skin:    General: Skin is warm and dry.  Neurological:     Mental Status: He is alert.  Psychiatric:        Mood and Affect: Mood and affect normal.        Speech: Speech normal.        Behavior: Behavior normal.     ED Results / Procedures / Treatments   Labs (all labs ordered are listed, but only abnormal results are displayed) Labs Reviewed  BASIC METABOLIC PANEL - Abnormal; Notable for the following components:      Result Value   Sodium 127 (*)    Chloride 92 (*)    All other components within normal limits  CBC - Abnormal; Notable for the following components:   Hemoglobin 12.7 (*)    All other components within normal limits  BRAIN NATRIURETIC PEPTIDE - Abnormal; Notable for the following components:   B Natriuretic Peptide 131.5 (*)    All other components within normal limits  SARS CORONAVIRUS 2 BY RT PCR [facility] ORDER, PERFORMED IN [facility] LAB)      EKG EKG Interpretation  Date/Time:  Wednesday April 19 2020 08:46:55 EDT Ventricular Rate:  90 PR Interval:  148 QRS Duration: 78 QT Interval:  364 QTC Calculation: 445 R Axis:   44 Text Interpretation: Normal sinus rhythm Minimal voltage criteria for LVH, may be normal variant ( Sokolow-Lyon ) Nonspecific ST and T wave abnormality Abnormal ECG Confirmed by Marianna Fuss ([id number]) on 04/19/2020 10:46:34 AM  Radiology DG Chest 2 View  Result Date: 04/19/2020 CLINICAL DATA:  Shortness of breath and tachypnea EXAM: CHEST - 2 VIEW COMPARISON:  April 14, 2020 chest radiograph and chest CT FINDINGS: There is persistent atelectatic change in the left lower lung region. There is no frank edema or airspace opacity. Heart is upper normal in size with pulmonary vascularity normal. No adenopathy. No bone lesions. IMPRESSION: Left lower lung region atelectasis. No frank edema or consolidation. Stable cardiac silhouette. No adenopathy. Electronically Signed   By: Bretta BangWilliam  Woodruff III M.D.   On: 04/19/2020 09:18    Procedures Procedures (including critical care time)  Medications Ordered in ED Medications  sodium chloride flush (NS) 0.9 % injection 3 mL (3 mLs Intravenous Given 04/19/20 1045)  albuterol (VENTOLIN HFA) 108 (90 Base) MCG/ACT inhaler 4 puff (4 puffs Inhalation Given 04/19/20 1045)  ipratropium (ATROVENT HFA) inhaler 2 puff (2 puffs Inhalation Given 04/19/20 1044)  methylPREDNISolone sodium succinate (SOLU-MEDROL) 125 mg/2 mL injection 125 mg (125 mg Intravenous Given 04/19/20 1054)  hydrALAZINE (APRESOLINE) tablet 100 mg (100 mg Oral Given 04/19/20 1152)  metoprolol tartrate (LOPRESSOR) tablet 12.5 mg (12.5 mg Oral Given 04/19/20 1152)  losartan (COZAAR) tablet 25 mg (25 mg Oral Given 04/19/20 1153)  isosorbide dinitrate (ISORDIL) tablet 30 mg (30 mg Oral Given 04/19/20 1153)    ED Course  I have reviewed the triage vital signs and the nursing notes.  Pertinent labs & imaging  results that were available during my care of the patient were reviewed by me and considered in my medical decision making (see chart for details).    MDM Rules/Calculators/A&P                          Patient presents complaining of shortness of breath. He states he felt short of breath when he was trying to move around and go outside to smoke a cigarette. I had a couple conversations with the patient regarding the intent of the oxygen.  Patient at first became upset when I told him that he would need to wear his oxygen all the time as the discharge note from yesterday indicated he would need to "wear his oxygen until able to wean."  He then told me he did not initially understand that he would need to wear it constantly.  He also said he did not know that he would need to have follow-up appointment in order to reassess his oxygen needs. Ultimately, due to his discharge less than 24 hours ago, I did recommend to the patient that he be admitted at least for observation.  Shared decision-making discussion was had regarding this recommendation.  Patient ultimately declined.  Patient improved during his ED course.  In fact, once we put him back on his prescribed home O2, he stated he felt better. Prior to discharge, patient ambulated, maintained SPO2 above 98%, and showed no signs of increased work of breathing and had no complaints of shortness of breath.  The patient was given instructions for home care as well as return precautions. Patient voices understanding of these instructions, accepts the plan, and is comfortable with discharge.  I reviewed and interpreted the patient's labs and radiological studies.   Findings and plan of care discussed with Marianna Fussichard Dykstra, MD. Dr. Stevie Kernykstra personally evaluated and examined this patient.  Vitals:   04/19/20 0857 04/19/20 0930 04/19/20 1130 04/19/20 1153  BP:  (!) 176/113 (!) 184/118 (!) 183/103  Pulse:  84  80  Resp:  18 19 19   Temp:      TempSrc:       SpO2: 98% 97%  95%     Final Clinical Impression(s) / ED Diagnoses Final diagnoses:  Shortness of breath    Rx / DC Orders ED Discharge Orders         Ordered    predniSONE (STERAPRED UNI-PAK 21 TAB) 10 MG (21) TBPK tablet     Discontinue  Reprint     04/19/20 1305           04/21/20, PA-C 04/19/20 1316    04/21/20, MD 04/20/20 [phone]

## 2020-04-19 NOTE — Discharge Instructions (Addendum)
Please wear your oxygen at all times. It was recommended that you have pulmonary function tests (PFTs) performed as an outpatient.  This is something that will need to be set up through a primary care provider or pulmonologist (lung specialist). Call to make an appointment.  You should expect to wear the oxygen at all times until told otherwise by one of these providers.  The oxygen condenser does not contain any oxygen.  It only produces oxygen taken from the surrounding air when plugged in and turned on.

## 2020-04-19 NOTE — ED Notes (Signed)
Patient verbalizes understanding of discharge instructions. Opportunity for questioning and answers were provided. Armband removed by staff, pt discharged from ED ambulatory on 3 lpm O2 via [place]. Pt provided with bus pass.

## 2020-04-19 NOTE — ED Notes (Signed)
Ambulated patient in the room. Pt's oxygen saturation remained at 98% on 4L of O2. Pt given coffee and resting comfortably.

## 2020-04-19 NOTE — ED Triage Notes (Signed)
Pt was just discharge yesterday on home o2 [facility]  Pt is tachypneic and able to answer questions.  Pt denies chest pain.  Edema to LE

## 2020-04-20 ENCOUNTER — Telehealth: Payer: Self-pay | Admitting: *Deleted

## 2020-04-20 ENCOUNTER — Other Ambulatory Visit: Payer: Self-pay

## 2020-04-20 DIAGNOSIS — J441 Chronic obstructive pulmonary disease with (acute) exacerbation: Secondary | ICD-10-CM

## 2020-04-20 NOTE — Patient Outreach (Signed)
  Medicaid Managed Care   Social Work Note  04/20/2020 Name: JERRETT BALDINGER MRN: [id number] DOB: 10/30/1955  Earvin Hansen is a 64 y.o. year old male who sees Patient, No Pcp Per for primary care. The  [facility] Managed Care team was consulted for assistance with housing and public safety.  Telephone outreach attempted today in response to a referral for assistance with housing and public safety concerns. LCSW was unable to reach patient by phone or leave message. LCSW left messages with the following Care Team members in an effort to collaborate regarding patient's status and needs:  Lorrin Jackson, CSW - [address] - [place], [place] - [phone] Laney Potash, RN - Communicable Diseases Nurse Specialist - [phone] Mauri Brooklyn, RN - Communicable Diseases Nurse Specialist - [phone] Loran Senters, Communicable Diseases Program Manager - [phone].  Received return call from [facility] regarding patient. Program Manager stated that because patient has tested positive for COVID, she would contact the shelter to alert them and he could be housed in the motel where they are housing Covid positive individuals who are also homeless.   SDOH (Social Determinants of Health) assessments performed: Yes SDOH Interventions     Most Recent Value  SDOH Interventions  SDOH Interventions for the Following Domains Housing  Housing Interventions Other (Comment)  [Outreach to [place] [facility] Sharper to determine status]         Follow Up Plan: SW will follow up with patient by phone over the next 24 hours. SW will attempt to contact community agencies as outlined above if no return call today.  Roselyn Bering, MSW, LCSW [facility] Managed Care

## 2020-04-20 NOTE — Telephone Encounter (Signed)
Contacted pt to complete transition of care assessment:  Transition Care Management Follow-up Telephone Call   [facility] Managed Care Transition Call Status:MM [facility] Call Made   Date of discharge and from where: [facility], 04/19/20   How have you been since you were released from the hospital?  "Up and down"   Any questions or concerns? no  Items Reviewed:  Did the pt receive and understand the discharge instructions provided? Yes   Medications obtained and verified? No , pt says his brother is going to take him to take him to get prescription for prednisone  Any new allergies since your discharge? No   Dietary orders reviewed? No  Do you have support at home? Home Health pt on continuous oxygen (Osygen concentrator); pt also resides at the [facility], [place])  Functional Questionnaire: (I = Independent and D = Dependent)  ADLs: Independent Bathing/Dressing:Independent Meal Prep: Independent Eating: Independent Maintaining continence: Independent Transferring/Ambulation: Independent Managing Meds: Dependent Follow up appointments reviewed:  PCP Hospital f/u appt confirmed? No  pt inpatient 04/14/20 - 04/18/20; discharged from hospital 04/18/20; he does not have a PCP  Specialist Hospital f/u appt confirmed? No pt inpatient 04/14/20 - 04/18/20; discharged from hospital 04/18/20; he has not made appt with pulmonology . Are transportation arrangements needed? Yes   If their condition worsens, is the pt aware to call PCP or go to the EmergencyDept.?  yes Was the patient provided with contact information for the PCP's office or ED? yes  Was to pt encouraged to call back with questions or concerns?    The pt Pharmacologist (the [employer]); [place] seen at [place] in [place], [place]. He says the address listed on demographics is his mailing address.Patient with multiple ED visits in less than 2 weeks, and multiple needs; will order  referral for [facility] Coordination; social work needs being addressed by MM LCSW, and MM Wellsite geologist.   Burnard Bunting, RN, BSN, CCRN [facility] [phone]

## 2020-05-10 ENCOUNTER — Emergency Department (HOSPITAL_COMMUNITY)
Admission: EM | Admit: 2020-05-10 | Discharge: 2020-05-12 | Disposition: A | Discharge status: Home / Self Care | Payer: Medicare Other | Attending: Emergency Medicine | Admitting: Emergency Medicine

## 2020-05-10 ENCOUNTER — Encounter (HOSPITAL_COMMUNITY): Payer: Self-pay

## 2020-05-10 ENCOUNTER — Emergency Department (HOSPITAL_COMMUNITY): Payer: Medicare Other

## 2020-05-10 DIAGNOSIS — F17200 Nicotine dependence, unspecified, uncomplicated: Secondary | ICD-10-CM | POA: Diagnosis not present

## 2020-05-10 DIAGNOSIS — R0602 Shortness of breath: Secondary | ICD-10-CM | POA: Diagnosis present

## 2020-05-10 DIAGNOSIS — J441 Chronic obstructive pulmonary disease with (acute) exacerbation: Secondary | ICD-10-CM | POA: Insufficient documentation

## 2020-05-10 DIAGNOSIS — Z9981 Dependence on supplemental oxygen: Secondary | ICD-10-CM | POA: Diagnosis not present

## 2020-05-10 DIAGNOSIS — I10 Essential (primary) hypertension: Secondary | ICD-10-CM | POA: Insufficient documentation

## 2020-05-10 NOTE — ED Triage Notes (Signed)
[facility] for [facility] d/t being out of home O2. Pt wears 2LPM all the time at home, EMS placed on 2L w/ improvement in SOB. No other complaints

## 2020-05-11 DIAGNOSIS — I10 Essential (primary) hypertension: Secondary | ICD-10-CM | POA: Diagnosis not present

## 2020-05-11 LAB — CBC WITH DIFFERENTIAL/PLATELET
Abs Immature Granulocytes: 0.04 K/uL (ref 0.00–0.07)
Basophils Absolute: 0 K/uL (ref 0.0–0.1)
Basophils Relative: 0 %
Eosinophils Absolute: 0.1 K/uL (ref 0.0–0.5)
Eosinophils Relative: 1 %
HCT: 40.5 % (ref 39.0–52.0)
Hemoglobin: 13 g/dL (ref 13.0–17.0)
Immature Granulocytes: 1 %
Lymphocytes Relative: 12 %
Lymphs Abs: 1 K/uL (ref 0.7–4.0)
MCH: 30.7 pg (ref 26.0–34.0)
MCHC: 32.1 g/dL (ref 30.0–36.0)
MCV: 95.5 fL (ref 80.0–100.0)
Monocytes Absolute: 0.9 K/uL (ref 0.1–1.0)
Monocytes Relative: 11 %
Neutro Abs: 6 K/uL (ref 1.7–7.7)
Neutrophils Relative %: 75 %
Platelets: 289 K/uL (ref 150–400)
RBC: 4.24 MIL/uL (ref 4.22–5.81)
RDW: 15 % (ref 11.5–15.5)
WBC: 8.1 K/uL (ref 4.0–10.5)
nRBC: 0 % (ref 0.0–0.2)

## 2020-05-11 LAB — BASIC METABOLIC PANEL WITH GFR
Anion gap: 11 (ref 5–15)
BUN: 9 mg/dL (ref 8–23)
CO2: 27 mmol/L (ref 22–32)
Calcium: 9.3 mg/dL (ref 8.9–10.3)
Chloride: 101 mmol/L (ref 98–111)
Creatinine, Ser: 1.1 mg/dL (ref 0.61–1.24)
GFR calc Af Amer: >60 mL/min (ref >60)
GFR calc non Af Amer: >60 mL/min (ref >60)
Glucose, Bld: 96 mg/dL (ref 70–99)
Potassium: 4.2 mmol/L (ref 3.5–5.1)
Sodium: 139 mmol/L (ref 135–145)

## 2020-05-11 MED ORDER — ALBUTEROL SULFATE HFA 108 (90 BASE) MCG/ACT IN AERS: 2.0000 | INHALATION_SPRAY | Freq: Once | RESPIRATORY_TRACT | Status: DC

## 2020-05-11 MED ORDER — HYDRALAZINE HCL 50 MG PO TABS
100.0000 mg | ORAL_TABLET | Freq: Three times a day (TID) | ORAL | Status: DC
  Administered 2020-05-11: 100 mg via ORAL
  Filled 2020-05-11: qty 2

## 2020-05-11 MED ORDER — ALBUTEROL SULFATE HFA 108 (90 BASE) MCG/ACT IN AERS
4.0000 | INHALATION_SPRAY | Freq: Once | RESPIRATORY_TRACT | Status: AC
  Administered 2020-05-11: 4 via RESPIRATORY_TRACT
  Filled 2020-05-11: qty 6.7

## 2020-05-11 MED ORDER — ALBUTEROL SULFATE HFA 108 (90 BASE) MCG/ACT IN AERS: 2.0000 | INHALATION_SPRAY | Freq: Four times a day (QID) | RESPIRATORY_TRACT | Status: DC | PRN

## 2020-05-11 MED ORDER — ISOSORBIDE DINITRATE 30 MG PO TABS
30.0000 mg | ORAL_TABLET | Freq: Three times a day (TID) | ORAL | Status: DC
  Filled 2020-05-11: qty 1

## 2020-05-11 MED ORDER — METOPROLOL TARTRATE 25 MG PO TABS
12.5000 mg | ORAL_TABLET | Freq: Two times a day (BID) | ORAL | Status: DC
  Administered 2020-05-11: 12.5 mg via ORAL
  Filled 2020-05-11 (×3): qty 1

## 2020-05-11 MED ORDER — LOSARTAN POTASSIUM 50 MG PO TABS
25.0000 mg | ORAL_TABLET | Freq: Every day | ORAL | Status: DC
  Administered 2020-05-11: 25 mg via ORAL
  Filled 2020-05-11: qty 1

## 2020-05-11 NOTE — Social Work (Signed)
CSW met with Pt at bedside. Pt states that he still has bed at [facility]. Pt reports that he has portable O2 that is currently charging. Pt has Medicare and is followed by [facility]. Pt asked for something to eat, CSW relayed request to NT. CSW will continue to follow for discharge needs.

## 2020-05-11 NOTE — Progress Notes (Signed)
   05/11/20 2211  TOC ED Mini Assessment  TOC Time spent with patient (minutes): 25  PING Used in TOC Assessment No  Admission or Readmission Diverted Yes  Interventions which prevented an admission or readmission Patient counseling  What brought you to the Emergency Department?  SOB  Barriers to Discharge Continued Medical Work up  Means of departure Not know

## 2020-05-11 NOTE — ED Provider Notes (Addendum)
[facility] EMERGENCY DEPARTMENT Provider Note   CSN: [id number] Arrival date & time: 05/10/20  2006     History Chief Complaint  Patient presents with  . Shortness of Breath    William Pruitt is a 64 y.o. male.  The history is provided by the patient and medical records. No language interpreter was used.   William Pruitt is a 64 y.o. male who presents to the Emergency Department complaining of shortness of breath. He states that he is on oxygen at baseline and he ran out of oxygen. [facility] and they make him go outside during the day. When he goes outside the power runs out on his concentrator hand he becomes short of breath. He has been without his medications today as well due to waiting in the emergency department. He denies any fevers, chest pain, nausea, vomiting, diarrhea, leg swelling or pain.    Past Medical History:  Diagnosis Date  . H/O ETOH abuse   . Hypertension     Patient Active Problem List   Diagnosis Date Noted  . Uncontrolled hypertension   . Dyspnea 04/14/2020  . Hypertensive urgency   . COPD exacerbation ([place])   . ETOH abuse 11/14/2019  . Pneumonia due to COVID-19 virus 11/14/2019    History reviewed. No pertinent surgical history.     History reviewed. No pertinent family history.  Social History   Tobacco Use  . Smoking status: Current Every Day Smoker    Packs/day: 1.00  . Smokeless tobacco: Never Used  Substance Use Topics  . Alcohol use: Yes    Comment: 4 beers per day  . Drug use: Not Currently    Home Medications Prior to Admission medications   Medication Sig Start Date End Date Taking? Authorizing Provider  acetaminophen (TYLENOL) 325 MG tablet Take 2 tablets (650 mg total) by mouth every 6 (six) hours as needed for mild pain or headache (fever >/= 101). 11/21/19   Cherene Altes, MD  albuterol (VENTOLIN HFA) 108 (90 Base) MCG/ACT inhaler Inhale 2 puffs into the lungs every 6 (six) hours  as needed for wheezing or shortness of breath. 04/18/20   Pashayan, Redgie Grayer, MD  hydrALAZINE (APRESOLINE) 100 MG tablet Take 1 tablet (100 mg total) by mouth every 8 (eight) hours. 11/21/19   Cherene Altes, MD  isosorbide dinitrate (ISORDIL) 30 MG tablet Take 1 tablet (30 mg total) by mouth 3 (three) times daily. 11/21/19   Cherene Altes, MD  losartan (COZAAR) 25 MG tablet Take 1 tablet (25 mg total) by mouth daily. 04/19/20   Briant Cedar, MD  metoprolol tartrate (LOPRESSOR) 25 MG tablet Take 0.5 tablets (12.5 mg total) by mouth 2 (two) times daily. 11/21/19   Cherene Altes, MD  predniSONE (STERAPRED UNI-PAK 21 TAB) 10 MG (21) TBPK tablet Take 6 tabs (93m) day 1, 5 tabs (560m day 2, 4 tabs (4075mday 3, 3 tabs (58m66may 4, 2 tabs (20mg44my 5, and 1 tab (10mg)13m 6. 04/19/20   Joy, Shawn C, PA-C  umeclidinium bromide (INCRUSE ELLIPTA) 62.5 MCG/INH AEPB Inhale 1 puff into the lungs daily. 04/18/20   PashayBriant Cedar  Allergies    Patient has no known allergies.  Review of Systems   Review of Systems  All other systems reviewed and are negative.   Physical Exam Updated Vital Signs BP (!) 212/128   Pulse 90   Temp 98.9 F (  37.2 C) (Oral)   Resp (!) 21   Ht _0  (1.88 m)   Wt 113 kg   SpO2 96%   BMI 31.99 kg/m   Physical Exam Vitals and nursing note reviewed.  Constitutional:      Appearance: He is well-developed.  HENT:     Head: Normocephalic and atraumatic.  Cardiovascular:     Rate and Rhythm: Normal rate and regular rhythm.     Heart sounds: No murmur heard.   Pulmonary:     Effort: Pulmonary effort is normal. No respiratory distress.     Comments: Diffuse wheezes without respiratory distress. Abdominal:     Palpations: Abdomen is soft.     Tenderness: There is no abdominal tenderness. There is no guarding or rebound.  Musculoskeletal:        General: No swelling or tenderness.  Skin:    General: Skin is warm and dry.    Neurological:     Mental Status: He is alert and oriented to person, place, and time.  Psychiatric:        Behavior: Behavior normal.     ED Results / Procedures / Treatments   Labs (all labs ordered are listed, but only abnormal results are displayed) Labs Reviewed  BASIC METABOLIC PANEL  CBC WITH DIFFERENTIAL/PLATELET    EKG ED ECG REPORT   Date: 05/10/2020  Rate: 102  Rhythm: sinus tachycardia  QRS Axis: normal  Intervals: normal  ST/T Wave abnormalities: normal  Conduction Disutrbances:none  Narrative Interpretation:   Old EKG Reviewed: none available Artifact limits interpretation I have personally reviewed the EKG tracing and agree with the computerized printout as noted.  Radiology DG Chest 2 View  Result Date: 05/10/2020 CLINICAL DATA:  Shortness of breath and cough.  Current smoker. EXAM: CHEST - 2 VIEW COMPARISON:  Radiograph 04/19/2020.  CT 04/14/2020. FINDINGS: Chronic elevation of left hemidiaphragm. Persistent low lung volumes. The heart is enlarged, unchanged from prior exam. Linear atelectasis/scarring in the lung but and left lower lobe. Subsegmental atelectasis in the right lower lobe. Peribronchial thickening, similar prior. No pneumothorax or large pleural effusion. Degenerative change in the spine. IMPRESSION: 1. Low lung volumes with chronic elevation of left hemidiaphragm. Chronic atelectasis/scarring in the lung bases. 2. Chronic peribronchial thickening likely related to underlying smoking. 3. Stable cardiomegaly. Electronically Signed   By: Keith Rake M.D.   On: 05/10/2020 21:07    Procedures Procedures (including critical care time)  Medications Ordered in ED Medications  hydrALAZINE (APRESOLINE) tablet 100 mg (100 mg Oral Given 05/11/20 2233)  isosorbide dinitrate (ISORDIL) tablet 30 mg (30 mg Oral Not Given 05/11/20 2236)  losartan (COZAAR) tablet 25 mg (25 mg Oral Given 05/11/20 2232)  metoprolol tartrate (LOPRESSOR) tablet 12.5 mg (12.5  mg Oral Given 05/11/20 2045)  albuterol (VENTOLIN HFA) 108 (90 Base) MCG/ACT inhaler 2 puff (has no administration in time range)  albuterol (VENTOLIN HFA) 108 (90 Base) MCG/ACT inhaler 4 puff (4 puffs Inhalation Given 05/11/20 2044)    ED Course  I have reviewed the triage vital signs and the nursing notes.  Pertinent labs & imaging results that were available during my care of the patient were reviewed by me and considered in my medical decision making (see chart for details).    MDM Rules/Calculators/A&P                         Patient with oxygen dependence here for evaluation of shortness of breath after running  out of oxygen. He does have wheezing on exam without respiratory distress. He is hypertensive but has been without his blood pressure medications during his 24 hour ED weight. Home blood pressure medications were administered. Case management met with the patient and arranged for him to [facility] so he will have access to power for his oxygen. Current presentation is not consistent with PE, pneumonia, hypertensive urgency. [facility].  Final Clinical Impression(s) / ED Diagnoses Final diagnoses:  Dependence on continuous supplemental oxygen  Essential hypertension    Rx / DC Orders ED Discharge Orders    None       Quintella Reichert, MD 05/11/20 2324    Quintella Reichert, MD 05/23/20 [phone]

## 2020-05-11 NOTE — Care Management (Signed)
ED CM met with patient to verify oxygen liters, noted orders from 7/27 3L continuous flow. Patient was discharged with a protable oxygen concentrator. Patient has Therapist, music in ED Resus Room.  Updated Dr. Ralene Bathe evaluation still in progress.

## 2020-05-11 NOTE — ED Notes (Signed)
Got up to walk to bathroom and became extremely short of breath, oxygen tubing was disconnected from tank.

## 2020-05-12 NOTE — ED Notes (Signed)
Pt's concentrator fully charged, pt taken to lobby on 4L [place] [address] to wait for cab. Pt to switch to concentrator when cab arrives.

## 2020-05-12 NOTE — ED Notes (Signed)
Patient provided with taxi voucher to [employer]

## 2020-05-21 ENCOUNTER — Emergency Department (HOSPITAL_COMMUNITY): Payer: Medicare Other

## 2020-05-21 ENCOUNTER — Encounter (HOSPITAL_COMMUNITY): Payer: Self-pay | Admitting: *Deleted

## 2020-05-21 ENCOUNTER — Other Ambulatory Visit: Payer: Self-pay

## 2020-05-21 ENCOUNTER — Inpatient Hospital Stay (HOSPITAL_COMMUNITY)
Admission: EM | Admit: 2020-05-21 | Discharge: 2020-05-29 | DRG: 208 | Disposition: A | Discharge status: Home / Self Care | Payer: Medicare Other | Attending: Internal Medicine | Admitting: Internal Medicine

## 2020-05-21 DIAGNOSIS — I161 Hypertensive emergency: Secondary | ICD-10-CM | POA: Diagnosis present

## 2020-05-21 DIAGNOSIS — J441 Chronic obstructive pulmonary disease with (acute) exacerbation: Principal | ICD-10-CM | POA: Diagnosis present

## 2020-05-21 DIAGNOSIS — I110 Hypertensive heart disease with heart failure: Secondary | ICD-10-CM | POA: Diagnosis present

## 2020-05-21 DIAGNOSIS — Z978 Presence of other specified devices: Secondary | ICD-10-CM

## 2020-05-21 DIAGNOSIS — F10131 Alcohol abuse with withdrawal delirium: Secondary | ICD-10-CM | POA: Diagnosis present

## 2020-05-21 DIAGNOSIS — J9811 Atelectasis: Secondary | ICD-10-CM | POA: Diagnosis present

## 2020-05-21 DIAGNOSIS — J9601 Acute respiratory failure with hypoxia: Secondary | ICD-10-CM

## 2020-05-21 DIAGNOSIS — I169 Hypertensive crisis, unspecified: Secondary | ICD-10-CM | POA: Diagnosis present

## 2020-05-21 DIAGNOSIS — Z7952 Long term (current) use of systemic steroids: Secondary | ICD-10-CM

## 2020-05-21 DIAGNOSIS — R7989 Other specified abnormal findings of blood chemistry: Secondary | ICD-10-CM | POA: Diagnosis present

## 2020-05-21 DIAGNOSIS — R0989 Other specified symptoms and signs involving the circulatory and respiratory systems: Secondary | ICD-10-CM

## 2020-05-21 DIAGNOSIS — I5033 Acute on chronic diastolic (congestive) heart failure: Secondary | ICD-10-CM | POA: Diagnosis present

## 2020-05-21 DIAGNOSIS — Z720 Tobacco use: Secondary | ICD-10-CM | POA: Diagnosis present

## 2020-05-21 DIAGNOSIS — Z9119 Patient's noncompliance with other medical treatment and regimen: Secondary | ICD-10-CM

## 2020-05-21 DIAGNOSIS — N179 Acute kidney failure, unspecified: Secondary | ICD-10-CM | POA: Diagnosis not present

## 2020-05-21 DIAGNOSIS — Z5900 Homelessness unspecified: Secondary | ICD-10-CM

## 2020-05-21 DIAGNOSIS — J9621 Acute and chronic respiratory failure with hypoxia: Secondary | ICD-10-CM | POA: Diagnosis present

## 2020-05-21 DIAGNOSIS — Z79899 Other long term (current) drug therapy: Secondary | ICD-10-CM

## 2020-05-21 DIAGNOSIS — Z20822 Contact with and (suspected) exposure to covid-19: Secondary | ICD-10-CM | POA: Diagnosis present

## 2020-05-21 DIAGNOSIS — Y900 Blood alcohol level of less than 20 mg/100 ml: Secondary | ICD-10-CM | POA: Diagnosis present

## 2020-05-21 DIAGNOSIS — I5189 Other ill-defined heart diseases: Secondary | ICD-10-CM

## 2020-05-21 DIAGNOSIS — J9611 Chronic respiratory failure with hypoxia: Secondary | ICD-10-CM | POA: Diagnosis present

## 2020-05-21 DIAGNOSIS — I160 Hypertensive urgency: Secondary | ICD-10-CM | POA: Diagnosis present

## 2020-05-21 DIAGNOSIS — G9341 Metabolic encephalopathy: Secondary | ICD-10-CM | POA: Diagnosis present

## 2020-05-21 DIAGNOSIS — Z9981 Dependence on supplemental oxygen: Secondary | ICD-10-CM

## 2020-05-21 DIAGNOSIS — Z8616 Personal history of COVID-19: Secondary | ICD-10-CM

## 2020-05-21 DIAGNOSIS — E871 Hypo-osmolality and hyponatremia: Secondary | ICD-10-CM | POA: Diagnosis present

## 2020-05-21 DIAGNOSIS — F17210 Nicotine dependence, cigarettes, uncomplicated: Secondary | ICD-10-CM | POA: Diagnosis present

## 2020-05-21 DIAGNOSIS — R778 Other specified abnormalities of plasma proteins: Secondary | ICD-10-CM | POA: Diagnosis present

## 2020-05-21 DIAGNOSIS — F1010 Alcohol abuse, uncomplicated: Secondary | ICD-10-CM | POA: Diagnosis present

## 2020-05-21 LAB — SARS CORONAVIRUS 2 BY RT PCR (HOSPITAL ORDER, PERFORMED IN ~~LOC~~ HOSPITAL LAB): SARS Coronavirus 2: NEGATIVE

## 2020-05-21 LAB — CBC
HCT: 42.1 % (ref 39.0–52.0)
Hemoglobin: 13.3 g/dL (ref 13.0–17.0)
MCH: 30.4 pg (ref 26.0–34.0)
MCHC: 31.6 g/dL (ref 30.0–36.0)
MCV: 96.3 fL (ref 80.0–100.0)
Platelets: 356 10*3/uL (ref 150–400)
RBC: 4.37 MIL/uL (ref 4.22–5.81)
RDW: 15.3 % (ref 11.5–15.5)
WBC: 7.9 10*3/uL (ref 4.0–10.5)
nRBC: 0 % (ref 0.0–0.2)

## 2020-05-21 LAB — BASIC METABOLIC PANEL
Anion gap: 10 (ref 5–15)
BUN: 8 mg/dL (ref 8–23)
CO2: 25 mmol/L (ref 22–32)
Calcium: 8.7 mg/dL — ABNORMAL LOW (ref 8.9–10.3)
Chloride: 99 mmol/L (ref 98–111)
Creatinine, Ser: 1.16 mg/dL (ref 0.61–1.24)
GFR calc Af Amer: >60 mL/min (ref >60)
GFR calc non Af Amer: >60 mL/min (ref >60)
Glucose, Bld: 104 mg/dL — ABNORMAL HIGH (ref 70–99)
Potassium: 4.2 mmol/L (ref 3.5–5.1)
Sodium: 134 mmol/L — ABNORMAL LOW (ref 135–145)

## 2020-05-21 NOTE — ED Triage Notes (Signed)
Pt is being brought in by [facility] for sob from the homeless [facility].  Pt has previously had COVID and there vaccine x2.  Pt has been wheezing, had albuterol, atrovent, mag, 125mg  solumedrol.  Pt spo2 75% on RA initially for ems and improved to upper 90's on 6L [place] (pt has home O2 which he can use but was not on when ems responded to him).

## 2020-05-22 DIAGNOSIS — F17210 Nicotine dependence, cigarettes, uncomplicated: Secondary | ICD-10-CM | POA: Diagnosis present

## 2020-05-22 DIAGNOSIS — Y900 Blood alcohol level of less than 20 mg/100 ml: Secondary | ICD-10-CM | POA: Diagnosis present

## 2020-05-22 DIAGNOSIS — Z8616 Personal history of COVID-19: Secondary | ICD-10-CM | POA: Diagnosis not present

## 2020-05-22 DIAGNOSIS — Z5900 Homelessness unspecified: Secondary | ICD-10-CM

## 2020-05-22 DIAGNOSIS — J9621 Acute and chronic respiratory failure with hypoxia: Secondary | ICD-10-CM | POA: Diagnosis present

## 2020-05-22 DIAGNOSIS — I110 Hypertensive heart disease with heart failure: Secondary | ICD-10-CM | POA: Diagnosis present

## 2020-05-22 DIAGNOSIS — Z20822 Contact with and (suspected) exposure to covid-19: Secondary | ICD-10-CM | POA: Diagnosis present

## 2020-05-22 DIAGNOSIS — G9341 Metabolic encephalopathy: Secondary | ICD-10-CM | POA: Diagnosis present

## 2020-05-22 DIAGNOSIS — I5033 Acute on chronic diastolic (congestive) heart failure: Secondary | ICD-10-CM | POA: Diagnosis present

## 2020-05-22 DIAGNOSIS — R778 Other specified abnormalities of plasma proteins: Secondary | ICD-10-CM | POA: Diagnosis not present

## 2020-05-22 DIAGNOSIS — I5189 Other ill-defined heart diseases: Secondary | ICD-10-CM

## 2020-05-22 DIAGNOSIS — J9811 Atelectasis: Secondary | ICD-10-CM | POA: Diagnosis present

## 2020-05-22 DIAGNOSIS — I161 Hypertensive emergency: Secondary | ICD-10-CM | POA: Diagnosis present

## 2020-05-22 DIAGNOSIS — Z79899 Other long term (current) drug therapy: Secondary | ICD-10-CM | POA: Diagnosis not present

## 2020-05-22 DIAGNOSIS — J9611 Chronic respiratory failure with hypoxia: Secondary | ICD-10-CM | POA: Diagnosis not present

## 2020-05-22 DIAGNOSIS — J9601 Acute respiratory failure with hypoxia: Secondary | ICD-10-CM | POA: Diagnosis not present

## 2020-05-22 DIAGNOSIS — N179 Acute kidney failure, unspecified: Secondary | ICD-10-CM | POA: Diagnosis not present

## 2020-05-22 DIAGNOSIS — Z720 Tobacco use: Secondary | ICD-10-CM | POA: Diagnosis present

## 2020-05-22 DIAGNOSIS — F10131 Alcohol abuse with withdrawal delirium: Secondary | ICD-10-CM | POA: Diagnosis present

## 2020-05-22 DIAGNOSIS — Z9981 Dependence on supplemental oxygen: Secondary | ICD-10-CM | POA: Diagnosis not present

## 2020-05-22 DIAGNOSIS — I160 Hypertensive urgency: Secondary | ICD-10-CM

## 2020-05-22 DIAGNOSIS — F1010 Alcohol abuse, uncomplicated: Secondary | ICD-10-CM

## 2020-05-22 DIAGNOSIS — Z9119 Patient's noncompliance with other medical treatment and regimen: Secondary | ICD-10-CM | POA: Diagnosis not present

## 2020-05-22 DIAGNOSIS — E871 Hypo-osmolality and hyponatremia: Secondary | ICD-10-CM | POA: Diagnosis present

## 2020-05-22 DIAGNOSIS — Z7952 Long term (current) use of systemic steroids: Secondary | ICD-10-CM | POA: Diagnosis not present

## 2020-05-22 DIAGNOSIS — I169 Hypertensive crisis, unspecified: Secondary | ICD-10-CM | POA: Diagnosis present

## 2020-05-22 DIAGNOSIS — J441 Chronic obstructive pulmonary disease with (acute) exacerbation: Secondary | ICD-10-CM | POA: Diagnosis present

## 2020-05-22 LAB — RAPID URINE DRUG SCREEN, HOSP PERFORMED
Amphetamines: NOT DETECTED
Barbiturates: NOT DETECTED
Benzodiazepines: NOT DETECTED
Cocaine: NOT DETECTED
Opiates: NOT DETECTED
Tetrahydrocannabinol: NOT DETECTED

## 2020-05-22 LAB — BRAIN NATRIURETIC PEPTIDE: B Natriuretic Peptide: 269.7 pg/mL — ABNORMAL HIGH (ref 0.0–100.0)

## 2020-05-22 LAB — HIV ANTIBODY (ROUTINE TESTING W REFLEX): HIV Screen 4th Generation wRfx: NONREACTIVE

## 2020-05-22 LAB — TROPONIN I (HIGH SENSITIVITY)
Troponin I (High Sensitivity): 30 ng/L — ABNORMAL HIGH (ref <18)
Troponin I (High Sensitivity): 24 ng/L — ABNORMAL HIGH (ref <18)

## 2020-05-22 MED ORDER — HYDRALAZINE HCL 50 MG PO TABS
100.0000 mg | ORAL_TABLET | Freq: Three times a day (TID) | ORAL | Status: DC
  Administered 2020-05-22 (×3): 100 mg via ORAL
  Filled 2020-05-22 (×4): qty 4

## 2020-05-22 MED ORDER — IPRATROPIUM BROMIDE 0.02 % IN SOLN
0.5000 mg | Freq: Once | RESPIRATORY_TRACT | Status: AC
  Administered 2020-05-22: 0.5 mg via RESPIRATORY_TRACT
  Filled 2020-05-22: qty 2.5

## 2020-05-22 MED ORDER — SODIUM CHLORIDE 0.9 % IV SOLN
500.0000 mg | INTRAVENOUS | Status: AC
  Administered 2020-05-22: 500 mg via INTRAVENOUS
  Filled 2020-05-22: qty 500

## 2020-05-22 MED ORDER — DICLOFENAC SODIUM 1 % EX GEL
4.0000 g | Freq: Four times a day (QID) | CUTANEOUS | Status: DC | PRN
  Filled 2020-05-22: qty 100

## 2020-05-22 MED ORDER — LOSARTAN POTASSIUM 25 MG PO TABS
25.0000 mg | ORAL_TABLET | Freq: Every day | ORAL | Status: DC
  Administered 2020-05-22: 25 mg via ORAL
  Filled 2020-05-22: qty 1

## 2020-05-22 MED ORDER — SODIUM CHLORIDE 0.9% FLUSH
3.0000 mL | Freq: Two times a day (BID) | INTRAVENOUS | Status: DC
  Administered 2020-05-22 – 2020-05-29 (×13): 3 mL via INTRAVENOUS

## 2020-05-22 MED ORDER — ISOSORBIDE DINITRATE 30 MG PO TABS
30.0000 mg | ORAL_TABLET | Freq: Once | ORAL | Status: AC
  Administered 2020-05-22: 30 mg via ORAL
  Filled 2020-05-22 (×3): qty 1

## 2020-05-22 MED ORDER — METHYLPREDNISOLONE SODIUM SUCC 125 MG IJ SOLR
60.0000 mg | Freq: Three times a day (TID) | INTRAMUSCULAR | Status: AC
  Administered 2020-05-22 (×2): 60 mg via INTRAVENOUS
  Filled 2020-05-22 (×2): qty 2

## 2020-05-22 MED ORDER — PREDNISONE 10 MG PO TABS: 40.0000 mg | ORAL_TABLET | Freq: Every day | ORAL | Status: DC

## 2020-05-22 MED ORDER — LABETALOL HCL 5 MG/ML IV SOLN
20.0000 mg | Freq: Once | INTRAVENOUS | Status: AC
  Administered 2020-05-22: 20 mg via INTRAVENOUS
  Filled 2020-05-22: qty 4

## 2020-05-22 MED ORDER — LORAZEPAM 2 MG/ML IJ SOLN
0.0000 mg | Freq: Four times a day (QID) | INTRAMUSCULAR | Status: DC
  Administered 2020-05-22: 1 mg via INTRAVENOUS
  Administered 2020-05-23: 4 mg via INTRAVENOUS
  Filled 2020-05-22 (×2): qty 1
  Filled 2020-05-22: qty 2

## 2020-05-22 MED ORDER — IPRATROPIUM-ALBUTEROL 0.5-2.5 (3) MG/3ML IN SOLN
3.0000 mL | Freq: Once | RESPIRATORY_TRACT | Status: AC
  Administered 2020-05-22: 3 mL via RESPIRATORY_TRACT
  Filled 2020-05-22: qty 3

## 2020-05-22 MED ORDER — THIAMINE HCL 100 MG PO TABS
100.0000 mg | ORAL_TABLET | Freq: Every day | ORAL | Status: DC
  Administered 2020-05-22: 100 mg via ORAL
  Filled 2020-05-22: qty 1

## 2020-05-22 MED ORDER — ALBUTEROL (5 MG/ML) CONTINUOUS INHALATION SOLN
10.0000 mg/h | INHALATION_SOLUTION | Freq: Once | RESPIRATORY_TRACT | Status: AC
  Administered 2020-05-22: 10 mg/h via RESPIRATORY_TRACT
  Filled 2020-05-22: qty 20

## 2020-05-22 MED ORDER — NICOTINE 7 MG/24HR TD PT24
7.0000 mg | MEDICATED_PATCH | Freq: Every day | TRANSDERMAL | Status: DC
  Administered 2020-05-22 – 2020-05-28 (×7): 7 mg via TRANSDERMAL
  Filled 2020-05-22 (×8): qty 1

## 2020-05-22 MED ORDER — IPRATROPIUM-ALBUTEROL 0.5-2.5 (3) MG/3ML IN SOLN
3.0000 mL | Freq: Four times a day (QID) | RESPIRATORY_TRACT | Status: DC
  Administered 2020-05-22 – 2020-05-23 (×7): 3 mL via RESPIRATORY_TRACT
  Filled 2020-05-22 (×7): qty 3

## 2020-05-22 MED ORDER — METOPROLOL TARTRATE 12.5 MG HALF TABLET
12.5000 mg | ORAL_TABLET | Freq: Two times a day (BID) | ORAL | Status: DC
  Administered 2020-05-22: 12.5 mg via ORAL
  Filled 2020-05-22: qty 1

## 2020-05-22 MED ORDER — LORAZEPAM 2 MG/ML IJ SOLN: 0.0000 mg | Freq: Two times a day (BID) | INTRAMUSCULAR | Status: DC

## 2020-05-22 MED ORDER — AZITHROMYCIN 500 MG PO TABS
500.0000 mg | ORAL_TABLET | Freq: Every day | ORAL | Status: DC
  Administered 2020-05-23: 500 mg via ORAL
  Filled 2020-05-22: qty 1

## 2020-05-22 MED ORDER — LORAZEPAM 1 MG PO TABS: 1.0000 mg | ORAL_TABLET | ORAL | Status: DC | PRN

## 2020-05-22 MED ORDER — LORAZEPAM 2 MG/ML IJ SOLN
1.0000 mg | INTRAMUSCULAR | Status: DC | PRN
  Administered 2020-05-22: 1 mg via INTRAVENOUS
  Administered 2020-05-22: 2 mg via INTRAVENOUS
  Administered 2020-05-23: 3 mg via INTRAVENOUS
  Filled 2020-05-22 (×2): qty 1
  Filled 2020-05-22: qty 2

## 2020-05-22 MED ORDER — THIAMINE HCL 100 MG/ML IJ SOLN: 100.0000 mg | Freq: Every day | INTRAMUSCULAR | Status: DC

## 2020-05-22 MED ORDER — ENOXAPARIN SODIUM 40 MG/0.4ML ~~LOC~~ SOLN
40.0000 mg | SUBCUTANEOUS | Status: DC
  Administered 2020-05-22 – 2020-05-29 (×8): 40 mg via SUBCUTANEOUS
  Filled 2020-05-22 (×8): qty 0.4

## 2020-05-22 MED ORDER — UMECLIDINIUM BROMIDE 62.5 MCG/INH IN AEPB
1.0000 | INHALATION_SPRAY | Freq: Every day | RESPIRATORY_TRACT | Status: DC
  Filled 2020-05-22: qty 7

## 2020-05-22 MED ORDER — HYDRALAZINE HCL 25 MG PO TABS
100.0000 mg | ORAL_TABLET | Freq: Once | ORAL | Status: AC
  Administered 2020-05-22: 100 mg via ORAL
  Filled 2020-05-22: qty 4

## 2020-05-22 MED ORDER — MAGNESIUM SULFATE 2 GM/50ML IV SOLN
2.0000 g | Freq: Once | INTRAVENOUS | Status: AC
  Administered 2020-05-22: 2 g via INTRAVENOUS
  Filled 2020-05-22: qty 50

## 2020-05-22 MED ORDER — FOLIC ACID 1 MG PO TABS
1.0000 mg | ORAL_TABLET | Freq: Every day | ORAL | Status: DC
  Administered 2020-05-22: 1 mg via ORAL
  Filled 2020-05-22: qty 1

## 2020-05-22 MED ORDER — HYDRALAZINE HCL 20 MG/ML IJ SOLN
10.0000 mg | INTRAMUSCULAR | Status: DC | PRN
  Administered 2020-05-22: 10 mg via INTRAVENOUS
  Filled 2020-05-22 (×2): qty 1

## 2020-05-22 MED ORDER — HYDRALAZINE HCL 20 MG/ML IJ SOLN
20.0000 mg | INTRAMUSCULAR | Status: DC | PRN
  Administered 2020-05-22: 20 mg via INTRAVENOUS
  Filled 2020-05-22: qty 1

## 2020-05-22 MED ORDER — LOSARTAN POTASSIUM 50 MG PO TABS
25.0000 mg | ORAL_TABLET | Freq: Once | ORAL | Status: AC
  Administered 2020-05-22: 25 mg via ORAL
  Filled 2020-05-22: qty 1

## 2020-05-22 MED ORDER — METOPROLOL TARTRATE 25 MG PO TABS
12.5000 mg | ORAL_TABLET | Freq: Once | ORAL | Status: AC
  Administered 2020-05-22: 12.5 mg via ORAL
  Filled 2020-05-22: qty 1

## 2020-05-22 MED ORDER — METHYLPREDNISOLONE SODIUM SUCC 125 MG IJ SOLR
80.0000 mg | Freq: Once | INTRAMUSCULAR | Status: AC
  Administered 2020-05-22: 80 mg via INTRAVENOUS
  Filled 2020-05-22: qty 2

## 2020-05-22 MED ORDER — ADULT MULTIVITAMIN W/MINERALS CH
1.0000 | ORAL_TABLET | Freq: Every day | ORAL | Status: DC
  Administered 2020-05-22: 1 via ORAL
  Filled 2020-05-22: qty 1

## 2020-05-22 NOTE — Progress Notes (Signed)
Md aware of BP, increased dose of hydralazine. No concerns at this time.

## 2020-05-22 NOTE — ED Notes (Signed)
Next please

## 2020-05-22 NOTE — Progress Notes (Addendum)
PT Cancellation Note  Patient Details Name: EXCELL NEYLAND MRN: [id number] DOB: 12/18/1955   Cancelled Treatment:    Reason Eval/Treat Not Completed: Other (comment).  Pt was off the floor, unavailable for PT eval.  Afterward, BP is elevated, will need to reattempt.  Retry as time and pt allow.   Ivar Drape 05/22/2020, 4:36 PM   Samul Dada, PT MS Acute Rehab Dept. Number: [facility] [id number] and [facility] [phone]

## 2020-05-22 NOTE — ED Notes (Signed)
When Pt stood to walk O2 dropped from 96 to 88 and while ambulating it dropped to 81. Pt was on 8 liters while walking

## 2020-05-22 NOTE — Progress Notes (Signed)
   05/22/20 1944  Assess: MEWS Score  Temp 98.5 F (36.9 C)  BP (!) 192/102  Pulse Rate (!) 114  Resp 20  SpO2 96 %  O2 Device Nasal Cannula  O2 Flow Rate (L/min) 3 L/min  Assess: MEWS Score  MEWS Temp 0  MEWS Systolic 0  MEWS Pulse 2  MEWS RR 0  MEWS LOC 0  MEWS Score 2  MEWS Score Color Yellow  Assess: if the MEWS score is Yellow or Red  Were vital signs taken at a resting state? Yes  Focused Assessment No change from prior assessment  Early Detection of Sepsis Score *See Row Information* Low  MEWS guidelines implemented *See Row Information* Yes  Treat  MEWS Interventions Escalated (See documentation below)  Pain Scale 0-10  Pain Score 0  Take Vital Signs  Increase Vital Sign Frequency  Yellow: Q 2hr X 2 then Q 4hr X 2, if remains yellow, continue Q 4hrs  Escalate  MEWS: Escalate Yellow: discuss with charge nurse/RN and consider discussing with provider and RRT  Notify: Charge Nurse/RN  Name of Charge Nurse/RN Notified [place], RN  Date Charge Nurse/RN Notified 05/22/20  Time Charge Nurse/RN Notified 1953  Document  Progress note created (see row info) Yes    Patient has been a chronic yellow and red MEWS D/T HR and BP being elevated. MD is aware. Will continue to monitor.

## 2020-05-22 NOTE — Progress Notes (Signed)
Patient asked someone for personal bags and patient was able to get liquor out of bag. NT noticed and took liquor away from patient. RN asked NT to throw it away.

## 2020-05-22 NOTE — ED Provider Notes (Signed)
[facility] EMERGENCY DEPARTMENT Provider Note   CSN: [id number] Arrival date & time: 05/21/20  1634     History Chief Complaint  Patient presents with  . Shortness of Breath    William Pruitt is a 64 y.o. male.  Hx COPD and hypertension here with increased shortness of breath x 1 day.  He is supposed to be on 3 L of oxygen at all times but is noncompliant with this.  [facility] and goes multiple times a day to charge his oxygen.  He however does not wear it all the time because it is too heavy to cart around.  He called EMS today with increased shortness of breath for the past 1 day with nonproductive cough.  Denies fever.  Denies chest pain.  He received albuterol, magnesium and Solu-Medrol by EMS.  He was initially hypoxic but was not wearing his oxygen.  He is not certain what he medications he takes for blood pressure but states he has been out for the past 1 day.  He also states he uses albuterol but no other breathing treatments on a regular basis.  No abdominal pain, fever or vomiting.  Does not think his legs are swollen.  Does not think he is ever diagnosed with CAD or CHF.  Covid infection in February but tested negative today.  Continues to smoke 5 cigarettes daily.  Has been out of BP meds x 1 day.  The history is provided by the patient and the EMS personnel.  Shortness of Breath Associated symptoms: cough   Associated symptoms: no abdominal pain, no chest pain, no fever, no headaches and no vomiting        Past Medical History:  Diagnosis Date  . H/O ETOH abuse   . Hypertension     Patient Active Problem List   Diagnosis Date Noted  . Uncontrolled hypertension   . Dyspnea 04/14/2020  . Hypertensive urgency   . COPD exacerbation (HCC)   . ETOH abuse 11/14/2019  . Pneumonia due to COVID-19 virus 11/14/2019    History reviewed. No pertinent surgical history.     No family history on file.  Social  History   Tobacco Use  . Smoking status: Current Every Day Smoker    Packs/day: 1.00  . Smokeless tobacco: Never Used  Substance Use Topics  . Alcohol use: Yes    Comment: 4 beers per day  . Drug use: Not Currently    Home Medications Prior to Admission medications   Medication Sig Start Date End Date Taking? Authorizing Provider  acetaminophen (TYLENOL) 325 MG tablet Take 2 tablets (650 mg total) by mouth every 6 (six) hours as needed for mild pain or headache (fever >/= 101). 11/21/19   Lonia Blood, MD  albuterol (VENTOLIN HFA) 108 (90 Base) MCG/ACT inhaler Inhale 2 puffs into the lungs every 6 (six) hours as needed for wheezing or shortness of breath. 04/18/20   Pashayan, Mardelle Matte, MD  hydrALAZINE (APRESOLINE) 100 MG tablet Take 1 tablet (100 mg total) by mouth every 8 (eight) hours. 11/21/19   Lonia Blood, MD  isosorbide dinitrate (ISORDIL) 30 MG tablet Take 1 tablet (30 mg total) by mouth 3 (three) times daily. 11/21/19   Lonia Blood, MD  losartan (COZAAR) 25 MG tablet Take 1 tablet (25 mg total) by mouth daily. 04/19/20   Lauro Franklin, MD  metoprolol tartrate (LOPRESSOR) 25 MG tablet Take 0.5 tablets (12.5 mg  total) by mouth 2 (two) times daily. 11/21/19   Lonia Blood, MD  predniSONE (STERAPRED UNI-PAK 21 TAB) 10 MG (21) TBPK tablet Take 6 tabs (60mg ) day 1, 5 tabs (50mg ) day 2, 4 tabs (40mg ) day 3, 3 tabs (30mg ) day 4, 2 tabs (20mg ) day 5, and 1 tab (10mg ) day 6. 04/19/20   Joy, Shawn C, PA-C  umeclidinium bromide (INCRUSE ELLIPTA) 62.5 MCG/INH AEPB Inhale 1 puff into the lungs daily. 04/18/20   , MD    Allergies    Patient has no known allergies.  Review of Systems   Review of Systems  Constitutional: Negative for activity change, appetite change, fatigue and fever.  HENT: Negative for congestion.   Respiratory: Positive for cough and shortness of breath. Negative for chest tightness.   Cardiovascular: Positive for leg  swelling. Negative for chest pain.  Gastrointestinal: Negative for abdominal pain, nausea and vomiting.  Genitourinary: Negative for dysuria, flank pain and hematuria.  Musculoskeletal: Negative for arthralgias and myalgias.  Neurological: Negative for weakness and headaches.   all other systems are negative except as noted in the HPI and PMH.    Physical Exam Updated Vital Signs BP (!) 209/123 (BP Location: Right Arm)   Pulse 98   Temp 99.5 F (37.5 C) (Oral)   Resp (!) 24   SpO2 94%   Physical Exam Vitals and nursing note reviewed.  Constitutional:      General: He is in acute distress.     Appearance: Normal appearance. He is well-developed and normal weight. He is not ill-appearing.     Comments: Tachypnea, speaking short phrases Disheveled  HENT:     Head: Normocephalic and atraumatic.     Mouth/Throat:     Pharynx: No oropharyngeal exudate.  Eyes:     Conjunctiva/sclera: Conjunctivae normal.     Pupils: Pupils are equal, round, and reactive to light.  Neck:     Comments: No meningismus. Cardiovascular:     Rate and Rhythm: Normal rate and regular rhythm.     Heart sounds: Normal heart sounds. No murmur heard.   Pulmonary:     Effort: Pulmonary effort is normal. No respiratory distress.     Breath sounds: Wheezing present.     Comments: Diffuse inspiratory expiratory wheezing Abdominal:     Palpations: Abdomen is soft.     Tenderness: There is no abdominal tenderness. There is no guarding or rebound.  Musculoskeletal:        General: No tenderness. Normal range of motion.     Cervical back: Normal range of motion and neck supple.     Right lower leg: Edema present.     Left lower leg: Edema present.     Comments: +1 brawny edema bilaterally.  Intact DP pulses bilaterally compartments soft  Skin:    General: Skin is warm.  Neurological:     Mental Status: He is alert and oriented to person, place, and time.     Cranial Nerves: No cranial nerve deficit.      Motor: No abnormal muscle tone.     Coordination: Coordination normal.     Comments: No ataxia on finger to nose bilaterally. No pronator drift. 5/5 strength throughout. CN 2-12 intact.Equal grip strength. Sensation intact.   Psychiatric:        Behavior: Behavior normal.     ED Results / Procedures / Treatments   Labs (all labs ordered are listed, but only abnormal results are displayed) Labs Reviewed  BASIC  METABOLIC PANEL - Abnormal; Notable for the following components:      Result Value   Sodium 134 (*)    Glucose, Bld 104 (*)    Calcium 8.7 (*)    All other components within normal limits  BRAIN NATRIURETIC PEPTIDE - Abnormal; Notable for the following components:   B Natriuretic Peptide 269.7 (*)    All other components within normal limits  TROPONIN I (HIGH SENSITIVITY) - Abnormal; Notable for the following components:   Troponin I (High Sensitivity) 30 (*)    All other components within normal limits  TROPONIN I (HIGH SENSITIVITY) - Abnormal; Notable for the following components:   Troponin I (High Sensitivity) 24 (*)    All other components within normal limits  SARS CORONAVIRUS 2 BY RT PCR (HOSPITAL ORDER, PERFORMED IN [facility])  CBC  RAPID URINE DRUG SCREEN, HOSP PERFORMED    EKG EKG Interpretation  Date/Time:  Monday May 22 2020 03:36:52 EDT Ventricular Rate:  99 PR Interval:    QRS Duration: 72 QT Interval:  342 QTC Calculation: 439 R Axis:   52 Text Interpretation: Sinus rhythm Probable left atrial enlargement Borderline repolarization abnormality No significant change was found Confirmed by Glynn Octave [phone]) on 05/22/2020 6:52:12 AM   Radiology DG Chest 2 View  Result Date: 05/21/2020 CLINICAL DATA:  Shortness of breath, high blood pressure, smoker EXAM: CHEST - 2 VIEW COMPARISON:  05/10/2020 FINDINGS: Enlargement of cardiac silhouette with slight vascular congestion. Mediastinal contours normal. Chronic eventration of LEFT  diaphragm. Persistent peribronchial thickening with bibasilar atelectasis, little changed. No definite acute infiltrate, pleural effusion or pneumothorax. Osseous structures unremarkable. IMPRESSION: Enlargement of cardiac silhouette. Chronic bronchitic changes and bibasilar atelectasis. No acute abnormalities. Electronically Signed   By: Ulyses Southward M.D.   On: 05/21/2020 17:07    Procedures .Critical Care Performed by: Glynn Octave, MD Authorized by: Glynn Octave, MD   Critical care provider statement:    Critical care time (minutes):  45   Critical care was necessary to treat or prevent imminent or life-threatening deterioration of the following conditions:  Respiratory failure   Critical care was time spent personally by me on the following activities:  Discussions with consultants, evaluation of patient's response to treatment, examination of patient, ordering and performing treatments and interventions, ordering and review of laboratory studies, ordering and review of radiographic studies, pulse oximetry, re-evaluation of patient's condition, obtaining history from patient or surrogate and review of old charts   (including critical care time)  Medications Ordered in ED Medications - No data to display  ED Course  I have reviewed the triage vital signs and the nursing notes.  Pertinent labs & imaging results that were available during my care of the patient were reviewed by me and considered in my medical decision making (see chart for details).    MDM Rules/Calculators/A&P                         Patient brought in by 1 day history of shortness of breath, cough and elevated blood pressure.  Received albuterol, magnesium and Solu-Medrol by EMS.  He has waited for approximately 10 hours so Solu-Medrol will be redosed.  He is still wheezy throughout with very tight air exchange and tachypneic.  Suspect COPD exacerbation as well as hypertensive urgency due to not having his  medications for several days.  His last echocardiogram showed a normal EF with some mild diastolic dysfunction.  Chest x-ray  today does not show pulmonary edema.  Patient does have increased oxygen requirement and requires more than his usual 3 L which he is noncompliant with at baseline.  When he walks around he desaturates to the mid eighties and his oxygen was turned up to 6 by nursing staff.  Blood pressure has improved with his home medications and is now 185 systolic but denies any chest pain.  Remains in mild distress with diffuse wheezing throughout. Will need admission for COPD exacerbation with hypertensive urgency.   BP increasing again. IV labetalol given.   Admission d/w Dr. Martyn MalayShaloub.   Earvin Hansenric T Trulson was evaluated in Emergency Department on 05/22/2020 for the symptoms described in the history of present illness. He was evaluated in the context of the global COVID-19 pandemic, which necessitated consideration that the patient might be at risk for infection with the SARS-CoV-2 virus that causes COVID-19. Institutional protocols and algorithms that pertain to the evaluation of patients at risk for COVID-19 are in a state of rapid change based on information released by regulatory bodies including the CDC and federal and state organizations. These policies and algorithms were followed during the patient's care in the ED.  Final Clinical Impression(s) / ED Diagnoses Final diagnoses:  COPD exacerbation (HCC)  Hypertensive urgency    Rx / DC Orders ED Discharge Orders    None       Arilla Hice, Jeannett SeniorStephen, MD 05/22/20 [phone]

## 2020-05-22 NOTE — Progress Notes (Signed)
Md aware of BP 

## 2020-05-22 NOTE — H&P (Addendum)
History and Physical    William Pruitt IOX:[id number] DOB: 1956/02/26 DOA: 05/21/2020  Referring MD/NP/PA: Shauna Hugh, MD PCP: Patient, No Pcp Per  Patient coming from: Via EMS from [facility] (homeless shelter)  Chief Complaint: Couldn't breathe  I have personally briefly reviewed patient's old medical records in [facility]   HPI: William Pruitt is a 64 y.o. male with medical history significant of chronic respiratory failure with hypoxia, hypertension, tobacco abuse, alcohol abuse, and prior COVID-19 infection in 10/2019 presents with complaints of being unable to breathe since yesterday.  He has been living at [facility] which is a homeless shelter.  He notes that he wears his oxygen intermittently as he has to go to the shelter to recharge the battery on the oxygen unit.  Notes associated symptoms of intermittently productive cough and wheezing.  Any kind of exertion makes him short of breath.  Reports that he is in need of a knee replacement on the right knee which does not help him getting around.  Denies having any chest pain, nausea, vomiting, leg swelling, calf pain, dysuria, or abdominal pain.  He has been getting his medications from the [facility] hospital, but ran out of them yesterday.  Patient just recently been hospitalized from 7/23-7/27 for COPD exacerbation with hypertensive urgency.  He continues to smoke cigarettes, but has cut back from 1 pack cigarettes/day  down to 5 cigarettes/day.  However, patient notes that he still drinks 2-3 tall cans of beer per day on average.  ED Course: Upon admission into the emergency department patient was seen to be afebrile, pulse 86-1 10, respiration 17-27, blood pressure is elevated up to 228/134, and O2 saturations currently maintained on 3 L nasal cannula oxygen.  Labs significant for sodium 134, BNP 269.7, and high-sensitivity troponin 30->24.  UDS was negative.  Chest x-ray showed enlarged cardiac silhouette and  chronic bronchiectatic changes and bibasilar atelectasis.  COVID-19 screening was negative.  Patient had been given 80 mg of Solu-Medrol IV, 2 g of magnesium sulfate, labetalol 20 mg IV, a continuous albuterol treatment, and restarted on blood pressure regimen.  Review of Systems  Constitutional: Positive for malaise/fatigue. Negative for chills and fever.  HENT: Negative for nosebleeds.   Eyes: Negative for photophobia and discharge.  Respiratory: Positive for cough, shortness of breath and wheezing.   Cardiovascular: Negative for chest pain and leg swelling.  Gastrointestinal: Negative for abdominal pain, nausea and vomiting.  Genitourinary: Negative for dysuria and flank pain.  Musculoskeletal: Positive for joint pain. Negative for falls.  Skin: Positive for rash.  Neurological: Negative for focal weakness and loss of consciousness.  Psychiatric/Behavioral: Positive for substance abuse.    Past Medical History:  Diagnosis Date  . H/O ETOH abuse   . Hypertension     History reviewed. No pertinent surgical history.   reports that he has been smoking. He has been smoking about 1.00 pack per day. He has never used smokeless tobacco. He reports current alcohol use. He reports previous drug use.  No Known Allergies  No family history on file.  Prior to Admission medications   Medication Sig Start Date End Date Taking? Authorizing Provider  acetaminophen (TYLENOL) 325 MG tablet Take 2 tablets (650 mg total) by mouth every 6 (six) hours as needed for mild pain or headache (fever >/= 101). 11/21/19  Yes Lonia Blood, MD  albuterol (VENTOLIN HFA) 108 (90 Base) MCG/ACT inhaler Inhale 2 puffs into the lungs every 6 (six)  hours as needed for wheezing or shortness of breath. 04/18/20  Yes Pashayan, Mardelle Matte, MD  hydrALAZINE (APRESOLINE) 100 MG tablet Take 1 tablet (100 mg total) by mouth every 8 (eight) hours. 11/21/19   Lonia Blood, MD  isosorbide dinitrate (ISORDIL) 30 MG  tablet Take 1 tablet (30 mg total) by mouth 3 (three) times daily. 11/21/19   Lonia Blood, MD  losartan (COZAAR) 25 MG tablet Take 1 tablet (25 mg total) by mouth daily. 04/19/20   Lauro Franklin, MD  metoprolol tartrate (LOPRESSOR) 25 MG tablet Take 0.5 tablets (12.5 mg total) by mouth 2 (two) times daily. 11/21/19   Lonia Blood, MD  predniSONE (STERAPRED UNI-PAK 21 TAB) 10 MG (21) TBPK tablet Take 6 tabs (60mg ) day 1, 5 tabs (50mg ) day 2, 4 tabs (40mg ) day 3, 3 tabs (30mg ) day 4, 2 tabs (20mg ) day 5, and 1 tab (10mg ) day 6. 04/19/20   Joy, Shawn C, PA-C  umeclidinium bromide (INCRUSE ELLIPTA) 62.5 MCG/INH AEPB Inhale 1 puff into the lungs daily. 04/18/20   , MD    Physical Exam:  Constitutional: Elderly appearing male who appears to be in no acute distress Vitals:   05/22/20 0626 05/22/20 0649 05/22/20 0657 05/22/20 0658  BP:  (!) 203/94    Pulse: (!) 104 (!) 110 (!) 107 (!) 107  Resp: 19 (!) 27 (!) 27 (!) 26  Temp:      TempSrc:      SpO2: 91% 90% 91% 91%   Eyes: PERRL, lids and conjunctivae normal ENMT: Mucous membranes are moist. Posterior pharynx clear of any exudate or lesions. Poor dentition Neck: normal, supple, no masses, no thyromegaly. Respiratory: Expiratory wheezes appreciated in both lung fields.  Patient currently on 3 L nasal cannula oxygen with O2 saturations intermittently dropping into the 80s with talking. Cardiovascular: Mildly tachycardic, no murmurs / rubs / gallops.  Trace lower extremity edema. 2+ pedal pulses. No carotid bruits.  Abdomen: no tenderness, no masses palpated. No hepatosplenomegaly. Bowel sounds positive.  Musculoskeletal: Clubbing appreciated of the bilateral hands.  Joint deformity of the right knee.  Normal muscle tone. Skin: White plaque-like rash noted of the left temporal lobe of the scalp, and dry skin Neurologic: CN 2-12 grossly intact. Sensation intact, DTR normal. Strength 5/5 in all 4.  Psychiatric:  Normal judgment and insight. Alert and oriented x 3. Normal mood.     Labs on Admission: I have personally reviewed following labs and imaging studies  CBC: Recent Labs  Lab 05/21/20 1704  WBC 7.9  HGB 13.3  HCT 42.1  MCV 96.3  PLT 356   Basic Metabolic Panel: Recent Labs  Lab 05/21/20 1704  NA 134*  K 4.2  CL 99  CO2 25  GLUCOSE 104*  BUN 8  CREATININE 1.16  CALCIUM 8.7*   GFR: Estimated Creatinine Clearance: 84.9 mL/min (by C-G formula based on SCr of 1.16 mg/dL). Liver Function Tests: No results for input(s): AST, ALT, ALKPHOS, BILITOT, PROT, ALBUMIN in the last 168 hours. No results for input(s): LIPASE, AMYLASE in the last 168 hours. No results for input(s): AMMONIA in the last 168 hours. Coagulation Profile: No results for input(s): INR, PROTIME in the last 168 hours. Cardiac Enzymes: No results for input(s): CKTOTAL, CKMB, CKMBINDEX, TROPONINI in the last 168 hours. BNP (last 3 results) No results for input(s): PROBNP in the last 8760 hours. HbA1C: No results for input(s): HGBA1C in the last 72 hours. CBG: No results for  input(s): GLUCAP in the last 168 hours. Lipid Profile: No results for input(s): CHOL, HDL, LDLCALC, TRIG, CHOLHDL, LDLDIRECT in the last 72 hours. Thyroid Function Tests: No results for input(s): TSH, T4TOTAL, FREET4, T3FREE, THYROIDAB in the last 72 hours. Anemia Panel: No results for input(s): VITAMINB12, FOLATE, FERRITIN, TIBC, IRON, RETICCTPCT in the last 72 hours. Urine analysis: No results found for: COLORURINE, APPEARANCEUR, LABSPEC, PHURINE, GLUCOSEU, HGBUR, BILIRUBINUR, KETONESUR, PROTEINUR, UROBILINOGEN, NITRITE, LEUKOCYTESUR Sepsis Labs: Recent Results (from the past 240 hour(s))  SARS Coronavirus 2 by RT PCR (hospital order, performed in [facility] hospital lab) Nasopharyngeal Nasopharyngeal Swab     Status: None   Collection Time: 05/21/20  4:44 PM   Specimen: Nasopharyngeal Swab  Result Value Ref Range Status   SARS  Coronavirus 2 NEGATIVE NEGATIVE Final    Comment: (NOTE) SARS-CoV-2 target nucleic acids are NOT DETECTED.  The SARS-CoV-2 RNA is generally detectable in upper and lower respiratory specimens during the acute phase of infection. The lowest concentration of SARS-CoV-2 viral copies this assay can detect is 250 copies / mL. A negative result does not preclude SARS-CoV-2 infection and should not be used as the sole basis for treatment or other patient management decisions.  A negative result may occur with improper specimen collection / handling, submission of specimen other than nasopharyngeal swab, presence of viral mutation(s) within the areas targeted by this assay, and inadequate number of viral copies (<250 copies / mL). A negative result must be combined with clinical observations, patient history, and epidemiological information.  Fact Sheet for Patients:   BoilerBrush.com.cy  Fact Sheet for Healthcare Providers: https://pope.com/  This test is not yet approved or  cleared by the [place] FDA and has been authorized for detection and/or diagnosis of SARS-CoV-2 by FDA under an Emergency Use Authorization (EUA).  This EUA will remain in effect (meaning this test can be used) for the duration of the COVID-19 declaration under Section 564(b)(1) of the Act, 21 U.S.C. section 360bbb-3(b)(1), unless the authorization is terminated or revoked sooner.  Performed at [facility], [address], [place], [place] [address]      Radiological Exams on Admission: DG Chest 2 View  Result Date: 05/21/2020 CLINICAL DATA:  Shortness of breath, high blood pressure, smoker EXAM: CHEST - 2 VIEW COMPARISON:  05/10/2020 FINDINGS: Enlargement of cardiac silhouette with slight vascular congestion. Mediastinal contours normal. Chronic eventration of LEFT diaphragm. Persistent peribronchial thickening with bibasilar atelectasis, little changed.  No definite acute infiltrate, pleural effusion or pneumothorax. Osseous structures unremarkable. IMPRESSION: Enlargement of cardiac silhouette. Chronic bronchitic changes and bibasilar atelectasis. No acute abnormalities. Electronically Signed   By: Ulyses Southward M.D.   On: 05/21/2020 17:07    EKG: Independently reviewed.  Sinus rhythm at 99 bpm with left atrial abnormality  Assessment/Plan  Respiratory failure with hypoxia COPD exacerbation: Acute on chronic.  Patient sent with complaints of being unable to breathe.  He is supposed to be on 3 L of nasal cannula oxygen chronically, but only uses intermittently.    Chest x-ray with chronic bronchiectatic changes.  Previously noted to have centrilobular and paraseptal emphysema changes on CT from 7/23.  Patient has been given 80 mg of Solu-Medrol and albuterol neb. -Admit to medical telemetry bed -COPD admission order set utilized -Continuous pulse oximetry with nasal cannula oxygen to maintain O2 saturation greater than 90% -Incentive spirometer -DuoNebs 4 times daily -Solu-Medrol 60 mg IV every 8 hours x2, then 40 mg daily -Empiric antibiotics of azithromycin -PT/OT  consult  Hypertensive urgency: Acute.  Patient noted to have blood pressure elevated to 228/134 on admission.  Notes that he had recently run out of his home blood pressure medications.  Previous home medications include metoprolol 12.5 mg p.o. twice daily, losartan 25 mg p.o. daily, and hydralazine 100 mg p.o. every 8 hours. -Restart home regimen -Hydralazine IV as needed.  Elevated troponin: Acute on chronic: Initial troponin mildly elevated up to 30, but repeat 24.  Suspect secondary to demand given respiratory distress.  EKG did not show any acute ischemic changes and patient did not report any complaints of chest pain. -Continue to monitor  Diastolic dysfunction: Chronic. Patient does not appear grossly fluid overloaded at this time. Labs revealed elevated BNP of 269.7 which  was higher than priors.  Last EF noted to be 65-70% with grade 1 diastolic dysfunction by echocardiogram from 7/23.   -Strict intake and output -Daily weights  Hyponatremia: Acute on chronic.  Sodium 134 on admission.  Suspect secondary to patient's history of alcohol abuse. -Continue to monitor   Tobacco abuse: Chronic.  Prior 51 pack year smoking history, but has cut back from 1 pack of cigarettes per day down to 5 cigarettes/day.  -Encouraged continued cessation of tobacco use -Nicotine patch offered  Alcohol abuse: Chronic.  Patient reports drinking 2-3 tall cans of beer per day on average. -CIWA protocols initiated  Homelessness: Patient currently living at [facility] which is a homeless shelter.  DVT prophylaxis:  Lovenox Code Status: Full Family Communication: Left message for the patient's brother regarding his admission into the hospital. Disposition Plan: Likely discharge back to homeless shelter once medically stable Consults called: None Admission status: Inpatient  Clydie Braunondell A Denis Koppel MD [facility] Pager [phone]   If 7PM-7AM, please contact night-coverage [email or web address] Password [email or web address]  05/22/2020, 7:28 AM

## 2020-05-22 NOTE — Progress Notes (Signed)
Pt refused skin assessment and refused to take pants off for assessment. Limited assessment was performed on areas that were visible. No wounds noted on visible areas. Pt states "I have no wounds". Tito[employer] vodka was found in his sock. Pt states he drinks 2 of those a day and last drank vodka yesterday. He refused to allow staff to place it in the safe with security. He asked to place it in his bag across the room. Rosanne Gutting and MD aware. Pt currently on Ciwar protocol.

## 2020-05-22 NOTE — ED Notes (Signed)
ED Provider at bedside. 

## 2020-05-23 ENCOUNTER — Inpatient Hospital Stay (HOSPITAL_COMMUNITY): Payer: Medicare Other

## 2020-05-23 DIAGNOSIS — J9601 Acute respiratory failure with hypoxia: Secondary | ICD-10-CM

## 2020-05-23 DIAGNOSIS — I5189 Other ill-defined heart diseases: Secondary | ICD-10-CM

## 2020-05-23 DIAGNOSIS — R778 Other specified abnormalities of plasma proteins: Secondary | ICD-10-CM

## 2020-05-23 DIAGNOSIS — J441 Chronic obstructive pulmonary disease with (acute) exacerbation: Principal | ICD-10-CM

## 2020-05-23 DIAGNOSIS — J9621 Acute and chronic respiratory failure with hypoxia: Secondary | ICD-10-CM

## 2020-05-23 LAB — CBC
HCT: 40 % (ref 39.0–52.0)
Hemoglobin: 12.9 g/dL — ABNORMAL LOW (ref 13.0–17.0)
MCH: 31.3 pg (ref 26.0–34.0)
MCHC: 32.3 g/dL (ref 30.0–36.0)
MCV: 97.1 fL (ref 80.0–100.0)
Platelets: 341 10*3/uL (ref 150–400)
RBC: 4.12 MIL/uL — ABNORMAL LOW (ref 4.22–5.81)
RDW: 15.9 % — ABNORMAL HIGH (ref 11.5–15.5)
WBC: 12.5 10*3/uL — ABNORMAL HIGH (ref 4.0–10.5)
nRBC: 0 % (ref 0.0–0.2)

## 2020-05-23 LAB — BASIC METABOLIC PANEL
Anion gap: 10 (ref 5–15)
BUN: 15 mg/dL (ref 8–23)
CO2: 27 mmol/L (ref 22–32)
Calcium: 8.9 mg/dL (ref 8.9–10.3)
Chloride: 99 mmol/L (ref 98–111)
Creatinine, Ser: 1.22 mg/dL (ref 0.61–1.24)
GFR calc Af Amer: >60 mL/min (ref >60)
GFR calc non Af Amer: >60 mL/min (ref >60)
Glucose, Bld: 133 mg/dL — ABNORMAL HIGH (ref 70–99)
Potassium: 4.6 mmol/L (ref 3.5–5.1)
Sodium: 136 mmol/L (ref 135–145)

## 2020-05-23 LAB — POCT I-STAT 7, (LYTES, BLD GAS, ICA,H+H)
Acid-Base Excess: 5 mmol/L — ABNORMAL HIGH (ref 0.0–2.0)
Bicarbonate: 32.4 mmol/L — ABNORMAL HIGH (ref 20.0–28.0)
Calcium, Ion: 1.24 mmol/L (ref 1.15–1.40)
HCT: 38 % — ABNORMAL LOW (ref 39.0–52.0)
Hemoglobin: 12.9 g/dL — ABNORMAL LOW (ref 13.0–17.0)
O2 Saturation: 100 %
Patient temperature: 96.5
Potassium: 4.7 mmol/L (ref 3.5–5.1)
Sodium: 135 mmol/L (ref 135–145)
TCO2: 34 mmol/L — ABNORMAL HIGH (ref 22–32)
pCO2 arterial: 55.6 mmHg — ABNORMAL HIGH (ref 32.0–48.0)
pH, Arterial: 7.368 (ref 7.350–7.450)
pO2, Arterial: 213 mmHg — ABNORMAL HIGH (ref 83.0–108.0)

## 2020-05-23 LAB — PHOSPHORUS
Phosphorus: 5.3 mg/dL — ABNORMAL HIGH (ref 2.5–4.6)
Phosphorus: 4.2 mg/dL (ref 2.5–4.6)

## 2020-05-23 LAB — GLUCOSE, CAPILLARY
Glucose-Capillary: 139 mg/dL — ABNORMAL HIGH (ref 70–99)
Glucose-Capillary: 141 mg/dL — ABNORMAL HIGH (ref 70–99)
Glucose-Capillary: 130 mg/dL — ABNORMAL HIGH (ref 70–99)
Glucose-Capillary: 132 mg/dL — ABNORMAL HIGH (ref 70–99)
Glucose-Capillary: 136 mg/dL — ABNORMAL HIGH (ref 70–99)
Glucose-Capillary: 157 mg/dL — ABNORMAL HIGH (ref 70–99)
Glucose-Capillary: 123 mg/dL — ABNORMAL HIGH (ref 70–99)

## 2020-05-23 LAB — TRIGLYCERIDES: Triglycerides: 67 mg/dL (ref <150)

## 2020-05-23 LAB — MAGNESIUM
Magnesium: 2.4 mg/dL (ref 1.7–2.4)
Magnesium: 2.5 mg/dL — ABNORMAL HIGH (ref 1.7–2.4)

## 2020-05-23 MED ORDER — THIAMINE HCL 100 MG PO TABS
100.0000 mg | ORAL_TABLET | Freq: Every day | ORAL | Status: DC
  Administered 2020-05-23 – 2020-05-25 (×2): 100 mg
  Filled 2020-05-23 (×2): qty 1

## 2020-05-23 MED ORDER — METHYLPREDNISOLONE SODIUM SUCC 125 MG IJ SOLR
40.0000 mg | Freq: Two times a day (BID) | INTRAMUSCULAR | Status: DC
  Administered 2020-05-23: 40 mg via INTRAVENOUS
  Filled 2020-05-23: qty 2

## 2020-05-23 MED ORDER — ALBUTEROL SULFATE (2.5 MG/3ML) 0.083% IN NEBU
2.5000 mg | INHALATION_SOLUTION | RESPIRATORY_TRACT | Status: DC | PRN
  Administered 2020-05-23: 2.5 mg via RESPIRATORY_TRACT
  Filled 2020-05-23: qty 3

## 2020-05-23 MED ORDER — FENTANYL CITRATE (PF) 100 MCG/2ML IJ SOLN: 100.0000 ug | Freq: Once | INTRAMUSCULAR | Status: DC

## 2020-05-23 MED ORDER — ORAL CARE MOUTH RINSE
15.0000 mL | OROMUCOSAL | Status: DC
  Administered 2020-05-23 – 2020-05-25 (×22): 15 mL via OROMUCOSAL

## 2020-05-23 MED ORDER — FENTANYL CITRATE (PF) 100 MCG/2ML IJ SOLN
25.0000 ug | INTRAMUSCULAR | Status: DC | PRN
  Administered 2020-05-23 (×2): 100 ug via INTRAVENOUS
  Filled 2020-05-23 (×2): qty 2

## 2020-05-23 MED ORDER — HALOPERIDOL LACTATE 5 MG/ML IJ SOLN: 5.0000 mg | Freq: Once | INTRAMUSCULAR | Status: DC

## 2020-05-23 MED ORDER — VITAL HIGH PROTEIN PO LIQD
1000.0000 mL | ORAL | Status: DC
  Administered 2020-05-23: 1000 mL

## 2020-05-23 MED ORDER — METOPROLOL TARTRATE 12.5 MG HALF TABLET
25.0000 mg | ORAL_TABLET | Freq: Two times a day (BID) | ORAL | Status: DC
  Administered 2020-05-24 – 2020-05-25 (×2): 25 mg
  Filled 2020-05-23 (×6): qty 2

## 2020-05-23 MED ORDER — METHYLPREDNISOLONE SODIUM SUCC 125 MG IJ SOLR
40.0000 mg | Freq: Every day | INTRAMUSCULAR | Status: DC
  Administered 2020-05-24 – 2020-05-26 (×3): 40 mg via INTRAVENOUS
  Filled 2020-05-23 (×3): qty 2

## 2020-05-23 MED ORDER — VITAL HIGH PROTEIN PO LIQD
1000.0000 mL | ORAL | Status: DC
  Administered 2020-05-24: 1000 mL

## 2020-05-23 MED ORDER — FUROSEMIDE 10 MG/ML IJ SOLN
40.0000 mg | Freq: Once | INTRAMUSCULAR | Status: AC
  Administered 2020-05-23: 40 mg via INTRAVENOUS
  Filled 2020-05-23: qty 4

## 2020-05-23 MED ORDER — ROCURONIUM BROMIDE 50 MG/5ML IV SOSY
100.0000 mg | PREFILLED_SYRINGE | Freq: Once | INTRAVENOUS | Status: AC
  Administered 2020-05-23: 100 mg via INTRAVENOUS

## 2020-05-23 MED ORDER — FOLIC ACID 1 MG PO TABS
1.0000 mg | ORAL_TABLET | Freq: Every day | ORAL | Status: DC
  Administered 2020-05-23 – 2020-05-25 (×3): 1 mg
  Filled 2020-05-23 (×4): qty 1

## 2020-05-23 MED ORDER — HYDRALAZINE HCL 50 MG PO TABS
100.0000 mg | ORAL_TABLET | Freq: Three times a day (TID) | ORAL | Status: DC
  Administered 2020-05-23 – 2020-05-25 (×6): 100 mg
  Filled 2020-05-23 (×9): qty 2

## 2020-05-23 MED ORDER — POLYETHYLENE GLYCOL 3350 17 G PO PACK
17.0000 g | PACK | Freq: Every day | ORAL | Status: DC
  Administered 2020-05-23 – 2020-05-25 (×3): 17 g
  Filled 2020-05-23 (×3): qty 1

## 2020-05-23 MED ORDER — ETOMIDATE 2 MG/ML IV SOLN
10.0000 mg | Freq: Once | INTRAVENOUS | Status: AC
  Administered 2020-05-23: 10 mg via INTRAVENOUS

## 2020-05-23 MED ORDER — CHLORHEXIDINE GLUCONATE 0.12% ORAL RINSE (MEDLINE KIT)
15.0000 mL | Freq: Two times a day (BID) | OROMUCOSAL | Status: DC
  Administered 2020-05-23 – 2020-05-25 (×5): 15 mL via OROMUCOSAL

## 2020-05-23 MED ORDER — LORAZEPAM 2 MG/ML IJ SOLN
4.0000 mg | INTRAMUSCULAR | Status: DC | PRN
  Administered 2020-05-23 – 2020-05-24 (×6): 4 mg via INTRAVENOUS
  Filled 2020-05-23 (×6): qty 2

## 2020-05-23 MED ORDER — ADULT MULTIVITAMIN W/MINERALS CH
1.0000 | ORAL_TABLET | Freq: Every day | ORAL | Status: DC
  Administered 2020-05-23 – 2020-05-25 (×3): 1
  Filled 2020-05-23 (×4): qty 1

## 2020-05-23 MED ORDER — ARFORMOTEROL TARTRATE 15 MCG/2ML IN NEBU
15.0000 ug | INHALATION_SOLUTION | Freq: Two times a day (BID) | RESPIRATORY_TRACT | Status: DC
  Administered 2020-05-23 – 2020-05-29 (×12): 15 ug via RESPIRATORY_TRACT
  Filled 2020-05-23 (×15): qty 2

## 2020-05-23 MED ORDER — POLYETHYLENE GLYCOL 3350 17 G PO PACK: 17.0000 g | PACK | Freq: Every day | ORAL | Status: DC

## 2020-05-23 MED ORDER — PROSOURCE TF PO LIQD
45.0000 mL | Freq: Two times a day (BID) | ORAL | Status: DC
  Administered 2020-05-23: 45 mL
  Filled 2020-05-23: qty 45

## 2020-05-23 MED ORDER — DEXMEDETOMIDINE HCL IN NACL 400 MCG/100ML IV SOLN
0.4000 ug/kg/h | INTRAVENOUS | Status: DC
  Administered 2020-05-23: 1.1 ug/kg/h via INTRAVENOUS
  Administered 2020-05-23: 0.4 ug/kg/h via INTRAVENOUS
  Filled 2020-05-23 (×2): qty 100

## 2020-05-23 MED ORDER — METOPROLOL TARTRATE 5 MG/5ML IV SOLN: 2.5000 mg | INTRAVENOUS | Status: DC | PRN

## 2020-05-23 MED ORDER — THIAMINE HCL 100 MG/ML IJ SOLN
100.0000 mg | Freq: Every day | INTRAMUSCULAR | Status: DC
  Administered 2020-05-24 – 2020-05-26 (×2): 100 mg via INTRAVENOUS
  Filled 2020-05-23 (×4): qty 2

## 2020-05-23 MED ORDER — DOCUSATE SODIUM 50 MG/5ML PO LIQD
100.0000 mg | Freq: Two times a day (BID) | ORAL | Status: DC
  Administered 2020-05-23 – 2020-05-25 (×5): 100 mg
  Filled 2020-05-23 (×5): qty 10

## 2020-05-23 MED ORDER — MIDAZOLAM HCL 2 MG/2ML IJ SOLN: 2.0000 mg | Freq: Once | INTRAMUSCULAR | Status: AC

## 2020-05-23 MED ORDER — AZITHROMYCIN 500 MG PO TABS
500.0000 mg | ORAL_TABLET | Freq: Every day | ORAL | Status: DC
  Administered 2020-05-24 – 2020-05-25 (×2): 500 mg
  Filled 2020-05-23 (×3): qty 1

## 2020-05-23 MED ORDER — HYDRALAZINE HCL 20 MG/ML IJ SOLN
10.0000 mg | INTRAMUSCULAR | Status: DC | PRN
  Administered 2020-05-23: 20 mg via INTRAVENOUS
  Filled 2020-05-23: qty 1

## 2020-05-23 MED ORDER — PROSOURCE TF PO LIQD
90.0000 mL | Freq: Every day | ORAL | Status: DC
  Administered 2020-05-23 – 2020-05-25 (×9): 90 mL
  Filled 2020-05-23 (×9): qty 90

## 2020-05-23 MED ORDER — BUDESONIDE 0.5 MG/2ML IN SUSP
0.5000 mg | Freq: Two times a day (BID) | RESPIRATORY_TRACT | Status: DC
  Administered 2020-05-23 – 2020-05-25 (×4): 0.5 mg via RESPIRATORY_TRACT
  Filled 2020-05-23 (×4): qty 2

## 2020-05-23 MED ORDER — METOPROLOL TARTRATE 12.5 MG HALF TABLET
12.5000 mg | ORAL_TABLET | Freq: Two times a day (BID) | ORAL | Status: DC
  Administered 2020-05-23: 12.5 mg
  Filled 2020-05-23: qty 1

## 2020-05-23 MED ORDER — FENTANYL CITRATE (PF) 100 MCG/2ML IJ SOLN: 25.0000 ug | INTRAMUSCULAR | Status: DC | PRN

## 2020-05-23 MED ORDER — CLEVIDIPINE BUTYRATE 0.5 MG/ML IV EMUL
0.0000 mg/h | INTRAVENOUS | Status: DC
  Administered 2020-05-23: 1 mg/h via INTRAVENOUS
  Administered 2020-05-23: 3 mg/h via INTRAVENOUS
  Filled 2020-05-23 (×3): qty 100

## 2020-05-23 MED ORDER — PROPOFOL 1000 MG/100ML IV EMUL
5.0000 ug/kg/min | INTRAVENOUS | Status: DC
  Administered 2020-05-23 (×2): 50 ug/kg/min via INTRAVENOUS
  Administered 2020-05-23: 20 ug/kg/min via INTRAVENOUS
  Administered 2020-05-23: 40 ug/kg/min via INTRAVENOUS
  Administered 2020-05-24 – 2020-05-25 (×10): 50 ug/kg/min via INTRAVENOUS
  Filled 2020-05-23 (×8): qty 100
  Filled 2020-05-23: qty 200
  Filled 2020-05-23 (×7): qty 100

## 2020-05-23 MED ORDER — HALOPERIDOL LACTATE 5 MG/ML IJ SOLN: 2.0000 mg | Freq: Once | INTRAMUSCULAR | Status: DC

## 2020-05-23 MED ORDER — DOCUSATE SODIUM 50 MG/5ML PO LIQD: 100.0000 mg | Freq: Two times a day (BID) | ORAL | Status: DC

## 2020-05-23 MED ORDER — PANTOPRAZOLE SODIUM 40 MG IV SOLR
40.0000 mg | Freq: Every day | INTRAVENOUS | Status: DC
  Administered 2020-05-23 – 2020-05-25 (×3): 40 mg via INTRAVENOUS
  Filled 2020-05-23 (×3): qty 40

## 2020-05-23 MED ORDER — CHLORHEXIDINE GLUCONATE CLOTH 2 % EX PADS
6.0000 | MEDICATED_PAD | Freq: Every day | CUTANEOUS | Status: DC
  Administered 2020-05-23 – 2020-05-29 (×9): 6 via TOPICAL

## 2020-05-23 MED ORDER — MIDAZOLAM HCL 2 MG/2ML IJ SOLN
INTRAMUSCULAR | Status: AC
  Administered 2020-05-23: 2 mg via INTRAVENOUS
  Filled 2020-05-23: qty 2

## 2020-05-23 MED ORDER — FENTANYL CITRATE (PF) 100 MCG/2ML IJ SOLN
INTRAMUSCULAR | Status: AC
  Filled 2020-05-23: qty 2

## 2020-05-23 NOTE — Progress Notes (Signed)
OT Cancellation Note  Patient Details Name: William Pruitt MRN: [id number] DOB: 04/10/1956   Cancelled Treatment:    Reason Eval/Treat Not Completed: Other (comment); continues to have increased agitation levels. Will hold therapies as follow up as able.  Marcy Siren, OT Acute Rehabilitation Services Pager [phone] Office [phone]   Orlando Penner 05/23/2020, 1:29 PM

## 2020-05-23 NOTE — Plan of Care (Signed)
Pt is 49M on admission for COPD exacerbation, hypertensive emergency and ETOH withdrawal for whom I was called to bedside due to extreme agitated delirium resulting in getting out of bed and hitting forehead on room sink. CIWA 22 per RN. Prior to my arrival at bedside, asked RN to give 4mg  ativan. Upon arrival to bedside, found pt slightly drowsy in bed being held by 6 staff members and still pt combative and with no regards to safety. Per pt's sitter, pt hit his forehead at room sink and landed on the floor in a sitting position.  Ordered Precedex drip, CT head and consulted CCM for transfer to ICU for further management.

## 2020-05-23 NOTE — Progress Notes (Signed)
Patient constantly pulling at devices, oxygen, and CIWA 16. Provider paged for sitter order.

## 2020-05-23 NOTE — Progress Notes (Signed)
CSW received consult for patient due to his homelessness and chronic oxygen needs. CSW aware that patient has Medicare A/B and Medicaid. CSW will speak with patient regarding his options for placement once he is extubated and able to communicate.  Edwin Dada, MSW, LCSW-A Transitions of Care  Clinical Social Worker  [facility] Emergency Departments  Medical ICU [phone]

## 2020-05-23 NOTE — Progress Notes (Signed)
Patient fell in room while sitter was trying to get patient back in bed. Provider paged.

## 2020-05-23 NOTE — Progress Notes (Addendum)
Report given to nurse on 85M, patient transferred. Brother called and with no answer.

## 2020-05-23 NOTE — Progress Notes (Signed)
Initial Nutrition Assessment  DOCUMENTATION CODES:   Obesity unspecified  INTERVENTION:   Initiate tube feeding via OG tube: Vital High Protein at 30 ml/h (720 ml per day) Prosource TF 90 ml 5 times daily  Provides 1120 kcal (1656 kcal total with propofol), 173 gm protein, 602 ml free water daily   Obtain current weight when possible.   NUTRITION DIAGNOSIS:   Inadequate oral intake related to inability to eat as evidenced by NPO status.  GOAL:   Provide needs based on ASPEN/SCCM guidelines  MONITOR:   Vent status, TF tolerance, Labs  REASON FOR ASSESSMENT:   Ventilator, Consult Enteral/tube feeding initiation and management  ASSESSMENT:   64 yo male admitted with COPD exacerbation. PMH includes ETOH abuse, smoker, HTN, homelessness.   Discussed patient in ICU rounds and with RN today. Patient developed worsening agitation and transferred to the ICU 8/31, requiring intubation. Patient on propofol and cleviprex. Hoping to get cleviprex off today.  Received MD Consult for TF initiation and management. OG tube in place.  Patient is currently intubated on ventilator support MV: 10.2 L/min Temp (24hrs), Avg:97.8 F (36.6 C), Min:96.5 F (35.8 C), Max:98.5 F (36.9 C)  Propofol: 20.3 ml/hr providing 536 kcal from lipid Cleviprex: 4 ml/h providing 192 kcal from lipid  Labs reviewed. Phos 5.3 CBG: [phone]  Medications reviewed and include colace, folic acid, solu-medrol, MVI with minerals, miralax, thiamine, cleviprex, propofol.  No current weight available. Most recent weight is from 2 weeks ago.  NUTRITION - FOCUSED PHYSICAL EXAM:    Most Recent Value  Orbital Region No depletion  Upper Arm Region No depletion  Thoracic and Lumbar Region No depletion  Buccal Region Unable to assess  Temple Region No depletion  Clavicle Bone Region No depletion  Clavicle and Acromion Bone Region No depletion  Scapular Bone Region Unable to assess  Dorsal Hand No  depletion  Patellar Region No depletion  Anterior Thigh Region No depletion  Posterior Calf Region No depletion  Edema (RD Assessment) Moderate  Hair Reviewed  Eyes Unable to assess  Mouth Unable to assess  Skin Reviewed  Nails Reviewed       Diet Order:   Diet Order            Diet NPO time specified  Diet effective now                 EDUCATION NEEDS:   Not appropriate for education at this time  Skin:  Skin Assessment: Reviewed RN Assessment  Last BM:  8/30  Height:   Ht Readings from Last 1 Encounters:  05/10/20 6\' 2"  (1.88 m)    Weight:   Wt Readings from Last 1 Encounters:  05/10/20 113 kg    Ideal Body Weight:  86.4 kg  BMI:  32  Estimated Nutritional Needs:   Kcal:  1300-1600  Protein:  173 gm  Fluid:  >/= 1.8 L    05/12/20, RD, LDN, CNSC Please refer to Amion for contact information.

## 2020-05-23 NOTE — Significant Event (Signed)
Rapid Response Event Note   Reason for Call : s/p fall, delerium  Initial Focused Assessment:  Nursing staff notified me of pt with confusion and high CIWA. Upon arrival, William Pruitt was disoriented x4, moving all extremities with purposeful intention and standing at the side of the bed not following staff instruction to sit down in bed. CIWA 22. Tachycardic, hypertensive and tachyneic with purse lipped breathing. Sats on 4L [place] were 95%. PERRLA. BBS Rhonchi with upper airway expiratory wheeze from forced exhalation. Moderate abdominal accessory muscle use. Pt became combative, swinging at staff. Pt was repositioned to a supine position in bed and restrained. Ativan 4mg  IV was given at 0220 for CIWA 22 per order/protocol. Precedex infusion was ordered and started at 0.4 mcg/kg/hr. PCCM at bedside.   0300-HR 117, 248/170 (moving), RR 25 sats 95% on 4L [place] Pt transferred to 2M08 without complication.     Interventions:  -Ativan -Precedex infusion -PIV -tx 2M08   MD Notified: PTA TRH APP per nursing staff, PCCM consulted Call Time: 0215 Arrival Time: 0218 End Time: 0440  0219, RN

## 2020-05-23 NOTE — Progress Notes (Addendum)
NAME:  William Pruitt, MRN:  [id number], DOB:  03/22/1956, LOS: 1 ADMISSION DATE:  05/21/2020, CONSULTATION DATE:  8/31 REFERRING MD:  Dr. Katrinka Blazing TRH, CHIEF COMPLAINT:  ETOH withdrawal  Brief History   64 year old male admitted 8/29 for COPD exacerbation who developed agitation felt secondary to alcohol withdrawal.  PCCM called after failed improvement with 10 total milligrams of Ativan over the course of the shift.  Recently admitted to [facility] on 7/23 for COPD exacerbation and hypertensive crisis.  He was treated for COPD with steroids, bronchodilators, and azithromycin.  It was during this admission he developed his oxygen requirement was discharged on 3 L.  Hypertension was managed with oral agents.  He has been unable to be completely compliant with his oxygen because of his living situation.    On 8/30 he called EMS due to shortness of breath x24 hours.  He does reportedly continue to smoke and drink.   He was also tachypneic.  Blood pressure is again elevated with systolics greater than 200.  He was also hypoxic requiring up to 6 L with activity.  Chest x-ray non-acute.  He was admitted to the hospitalist service and treated for acute exacerbation of COPD with duo nebs, steroids, and azithromycin.  He was treated for hypertensive urgency with resumption of his home antihypertensive regimen including metoprolol, losartan, and hydralazine.  Later that evening the patient became progressively delirious and agitated.  At times he was getting out of bed and was combative with staff.  He was started on CIWA protocol for what was felt to be alcohol withdrawal, however, despite multiple doses of Ativan he remained profoundly agitated.  Around 2 AM while out of bed he fell from standing and reportedly struck his head on the sink in his room.  After a total of 10 mg of Ativan in an 8-hour period PCCM was consulted for consideration of Precedex infusion and ICU transfer.  Past Medical History  ETOH  abuse Tobacco Abuse   HTN Homelessness - living most recently at a shelter  Significant Hospital Events   8/30 Admit 8/31 worsening agitation transferred to ICU  Consults:    Procedures:  ETT 8/31 >>   Significant Diagnostic Tests:  CT head 8/31 >> negative, atrophy and chronic small vessel disease  Micro Data:  COVID 8/29 >> negative   Antimicrobials:  Azithromycin 8/30 >>  Interim history/subjective:  Intubated overnight On propofol, cleviprex  Afebrile  Glucose range 100-140 I/O 1.3L UOP, -1L in last 24 hours  Objective   Blood pressure (!) 130/108, pulse 81, temperature 97.6 F (36.4 C), temperature source Axillary, resp. rate 19, SpO2 94 %.    Vent Mode: PRVC FiO2 (%):  [60 %-100 %] 60 % Set Rate:  [16 bmp] 16 bmp Vt Set:  [580 mL-650 mL] 650 mL PEEP:  [5 cmH20] 5 cmH20 Plateau Pressure:  [16 cmH20-20 cmH20] 20 cmH20   Intake/Output Summary (Last 24 hours) at 05/23/2020 1123 Last data filed at 05/23/2020 1000 Gross per 24 hour  Intake 675.77 ml  Output 2305 ml  Net -1629.23 ml   There were no vitals filed for this visit.  Examination: General: adult male lying in bed on vent in NAD  HEENT: MM pink/moist, ETT Neuro: sedate CV: s1s2 RRR, SR in 60's, no m/r/g PULM: non-labored on vent, lungs bilaterally coarse GI: soft, bsx4 active  Extremities: warm/dry, 1-2+ pitting up to thighs edema  Skin: no rashes or lesions  Resolved Hospital Problem list  Assessment & Plan:   Acute agitated delirium ETOH Withdrawal  Known hx of alcohol abuse and his symptoms are certainly consistent with alcohol withdrawal and delirium tremens, however, he was drinking alcohol as recently as today and was even caught with some while inpatient. So this may not necessarily fit the timeline for alcohol withdrawal. He did fall and strike his head but CT head negative which is reassuring. Also considering profound hypertension his symptoms are also consistent with hypertensive  encephalopathy or PRES -change to propofol for sedation  -PRN ativan if needed for seizure -if no improvement in mental status with BP control, consider MRI to r/o PRES -follow neuro exam closely   Hypertensive crisis Second admission for this in the past month. Systolic blood pressure greater than 200 on admission and remains as such. -wean cleviprex to off  -continue PT hydralazine, increase lopressor dosing  -hold home ARB -liberalize BP control -tele / ICU monitoring   Acute on chronic hypoxemic respiratory failure Oxygen 3 L as an outpatient since July. Acute exacerbation of COPD -PRVC 8cc/kg  -wean PEEP / fiO2 for sats 88-94% -reduce solumedrol to 40 mg IV QD -add brovana, pulmicort BID  -reduce duoneb to PRN on 9/1 -follow intermittent CXR  Acute on chronic heart failure with preserved ejection fraction -BP control as above  -s/p 40 mg IV lasix 8/31  -follow I/O's, daily weights  Alcohol abuse Tobacco abuse -continue thiamine, folate, MVI -substance abuse cessation counseling once off vent   Best practice:  Diet: NPO / TF Pain/Anxiety/Delirium protocol (if indicated): Propofol + PRN fentanyl, ativan  VAP protocol (if indicated): N/A DVT prophylaxis: Lovenox GI prophylaxis: PPI Glucose control: NA Mobility: Bedrest Code Status: Full code Family Communication: Brother (Tim), called for update 8/31 Disposition: ICU  Labs   CBC: Recent Labs  Lab 05/21/20 1704 05/23/20 0442 05/23/20 0535  WBC 7.9 12.5*  --   HGB 13.3 12.9* 12.9*  HCT 42.1 40.0 38.0*  MCV 96.3 97.1  --   PLT 356 341  --     Basic Metabolic Panel: Recent Labs  Lab 05/21/20 1704 05/23/20 0442 05/23/20 0535  NA 134* 136 135  K 4.2 4.6 4.7  CL 99 99  --   CO2 25 27  --   GLUCOSE 104* 133*  --   BUN 8 15  --   CREATININE 1.16 1.22  --   CALCIUM 8.7* 8.9  --   MG  --  2.4  --   PHOS  --  5.3*  --    GFR: Estimated Creatinine Clearance: 80.7 mL/min (by C-G formula based on SCr  of 1.22 mg/dL). Recent Labs  Lab 05/21/20 1704 05/23/20 0442  WBC 7.9 12.5*    Liver Function Tests: No results for input(s): AST, ALT, ALKPHOS, BILITOT, PROT, ALBUMIN in the last 168 hours. No results for input(s): LIPASE, AMYLASE in the last 168 hours. No results for input(s): AMMONIA in the last 168 hours.  ABG    Component Value Date/Time   PHART 7.368 05/23/2020 0535   PCO2ART 55.6 (H) 05/23/2020 0535   PO2ART 213 (H) 05/23/2020 0535   HCO3 32.4 (H) 05/23/2020 0535   TCO2 34 (H) 05/23/2020 0535   O2SAT 100.0 05/23/2020 0535     Coagulation Profile: No results for input(s): INR, PROTIME in the last 168 hours.  Cardiac Enzymes: No results for input(s): CKTOTAL, CKMB, CKMBINDEX, TROPONINI in the last 168 hours.  HbA1C: No results found for: HGBA1C  CBG: Recent Labs  Lab 05/23/20 0241 05/23/20 0404 05/23/20 0756 05/23/20 1117  GLUCAP 139* 141* 130* 132*     Critical care time: 33 minutes     Canary Brim, MSN, NP-C  Pulmonary & Critical Care 05/23/2020, 11:23 AM   Please see [email or web address] for pager details.

## 2020-05-23 NOTE — Progress Notes (Signed)
eLink Physician-Brief Progress Note Patient Name: William Pruitt DOB: Jul 31, 1956 MRN: [id number]   Date of Service  05/23/2020  HPI/Events of Note  Patient somnolent and obstructing his airway.  eICU Interventions  Nasal trumpet inserted,  Ground crew requested to evaluate him for intubation, he will need a stat CT of the head following intubation.        Mekala Winger U Camari Quintanilla 05/23/2020, 4:30 AM

## 2020-05-23 NOTE — Progress Notes (Signed)
Patient transported on ventilator to CT and back to room 2M08 with no complications. Vitals stable.

## 2020-05-23 NOTE — Progress Notes (Signed)
PT Cancellation Note  Patient Details Name: William Pruitt MRN: [id number] DOB: [birth year]   Cancelled Treatment:    Reason Eval/Treat Not Completed: Patient not medically ready.  Pt in withdrawal and agitated.  Not able to work with therapies today. 05/23/2020  Jacinto Halim., PT Acute Rehabilitation Services [phone]  (pager) [phone]  (office)   Eliseo Gum Valta Dillon 05/23/2020, 5:11 PM

## 2020-05-23 NOTE — Procedures (Signed)
Intubation Procedure Note  William Pruitt  [id number]  20-Mar-1956  Date:05/23/20  Time:4:40 AM   Provider Performing:Hazelynn Mckenny [name]    Procedure: Intubation (31500)  Indication(s) Respiratory Failure  Consent Unable to obtain consent due to emergent nature of procedure.   Anesthesia Patient unresponsive prior to induction.  Etomidate 10 mg Rocuronium 100 mg   Time Out Verified patient identification, verified procedure, site/side was marked, verified correct patient position, special equipment/implants available, medications/allergies/relevant history reviewed, required imaging and test results available.   Sterile Technique Usual hand hygeine, masks, and gloves were used   Procedure Description Patient positioned in bed supine.  Sedation given as noted above.  Patient was intubated with endotracheal tube using Glidescope.  View was Grade 3 only epiglottis .  Number of attempts was 1.  Colorimetric CO2 detector was consistent with tracheal placement.   Complications/Tolerance None; patient tolerated the procedure well. Chest X-ray is ordered to verify placement.   EBL None   William Pruitt, AGACNP-BC [facility]  See Amion for personal pager PCCM on call pager [phone]  05/23/2020 4:42 AM

## 2020-05-23 NOTE — Consult Note (Signed)
NAME:  William Pruitt, MRN:  [id number], DOB:  1956/05/12, LOS: 1 ADMISSION DATE:  05/21/2020, CONSULTATION DATE:  8/31 REFERRING MD:  Dr. Katrinka Blazing TRH, CHIEF COMPLAINT:  DTs   Brief History   64 year old male admitted for COPD exacerbation developed agitation felt secondary to alcohol withdrawal.  PCCM called after failed improvement with 10 total milligrams of Ativan over the course of the shift.  History of present illness   64 year old male with past medical history as below, which is significant for COPD on 3 L, hypertension, and alcohol abuse.  It is my understanding that the patient is homeless and has been residing at the shelter.  He was recently admitted to [facility] on 7/23 for COPD exacerbation and hypertensive crisis.  He was treated for COPD with steroids, bronchodilators, and azithromycin.  It was during this admission he developed his oxygen requirement was discharged on 3 L.  Hypertension was managed with oral agents.  He has been unable to be completely compliant with his oxygen because of his living situation.  On 8/30 he called EMS due to shortness of breath x24 hours.  He does reportedly continue to smoke and drink.  Upon arrival to the emergency department he was noted to be wheezy with poor air movement on exam.  He was also tachypneic.  Blood pressure is again elevated with systolics greater than 200.  He was also hypoxic requiring up to 6 L with activity.  Chest x-ray non-acute.  He was admitted to the hospitalist service and treated for acute exacerbation of COPD with duo nebs, steroids, and azithromycin.  He was treated for hypertensive urgency with resumption of his home antihypertensive regimen including metoprolol, losartan, and hydralazine.  Later that evening the patient became progressively delirious and agitated.  At times he was getting out of bed and was combative with staff.  He was started on CIWA protocol for what was felt to be alcohol withdrawal, however, despite  multiple doses of Ativan he remained profoundly agitated.  Around 2 AM while out of bed he fell from standing and reportedly struck his head on the sink in his room.  After a total of 10 mg of Ativan in an 8-hour period PCCM was consulted for consideration of Precedex infusion and ICU transfer.  Past Medical History   has a past medical history of H/O ETOH abuse and Hypertension.   Significant Hospital Events   8/30 > admit 8/31 worsening agitation transferred to ICU  Consults:    Procedures:    Significant Diagnostic Tests:  CT head 8/31 >  Micro Data:    Antimicrobials:  Azithromycin 8/30 >  Interim history/subjective:    Objective   Blood pressure (!) 248/170, pulse (!) 122, temperature 98.2 F (36.8 C), temperature source Oral, resp. rate 16, SpO2 95 %.        Intake/Output Summary (Last 24 hours) at 05/23/2020 0317 Last data filed at 05/22/2020 2208 Gross per 24 hour  Intake 660 ml  Output 275 ml  Net 385 ml   There were no vitals filed for this visit.  Examination: General: Middle-aged male in moderate distress HENT: Normocephalic, atraumatic, PERRL, no appreciable JVD Lungs: Sonorous respirations, rapid shallow, unable to clearly auscultate lung fields due to upper airway noise. Cardiovascular: Tachycardic, regular, no MRG Abdomen: Soft, nontender, nondistended Extremities: No acute deformity, lower extremity edema Neuro: Does respond to voice, but is very agitated and fidgety. Somnolent  Resolved Hospital Problem list     Assessment &  Plan:   Acute agitated delirium: He has a known history of alcohol abuse and his symptoms are certainly consistent with alcohol withdrawal and delirium tremens, however, he was drinking alcohol as recently as today and was even caught with some while inpatient. So this may not necessarily fit the timeline for alcohol withdrawal. He did fall and strike his head tonight. Also considering profound hypertension his symptoms are  also consistent with hypertensive encephalopathy or PRES -CT head -Start Precedex infusion with a goal RASS of 0 to -1 -As needed Ativan for breakthrough agitation -As needed hydralazine for systolic blood pressure goal 170-180 -May need MRI if no improvement to rule out PRES -Repeat chemistry  Hypertensive crisis: This is his second admission for this in the past month. Systolic blood pressure greater than 200 on admission and remains as such. -DC oral antihypertensives as he is unable to take p.o. -Hydralazine, metoprolol for now may need continuous infusion -Systolic blood pressure goal 170 to 180 mmHg for the next 6 hours, then liberalized to less than 170. -Clonidine patch may be a good option if he does not respond to as needed's -Resume outpatient medications when taking p.o.  Acute on chronic hypoxemic respiratory failure: On 3 L as an outpatient since July. Acute exacerbation of COPD -Supplemental oxygen titrated to keep pulse ox 90 to 95% -Solu-Medrol -Scheduled DuoNebs and as needed albuterol  Acute on chronic heart failure with preserved ejection fraction -Blood pressure control as above -We will defer diuresis for now, but should consider when he becomes more stabilized  Alcohol abuse Tobacco abuse -cessation counseling when appropriate -Thiamine, folate  Best practice:  Diet: N.p.o. Pain/Anxiety/Delirium protocol (if indicated): Precedex VAP protocol (if indicated): N/A DVT prophylaxis: Lovenox GI prophylaxis: N/A Glucose control: NA Mobility: Bedrest Code Status: Full code Family Communication: Unable to reach patient's family Disposition: Transferred to ICU  Labs   CBC: Recent Labs  Lab 05/21/20 1704  WBC 7.9  HGB 13.3  HCT 42.1  MCV 96.3  PLT 356    Basic Metabolic Panel: Recent Labs  Lab 05/21/20 1704  NA 134*  K 4.2  CL 99  CO2 25  GLUCOSE 104*  BUN 8  CREATININE 1.16  CALCIUM 8.7*   GFR: Estimated Creatinine Clearance: 84.9  mL/min (by C-G formula based on SCr of 1.16 mg/dL). Recent Labs  Lab 05/21/20 1704  WBC 7.9    Liver Function Tests: No results for input(s): AST, ALT, ALKPHOS, BILITOT, PROT, ALBUMIN in the last 168 hours. No results for input(s): LIPASE, AMYLASE in the last 168 hours. No results for input(s): AMMONIA in the last 168 hours.  ABG No results found for: PHART, PCO2ART, PO2ART, HCO3, TCO2, ACIDBASEDEF, O2SAT   Coagulation Profile: No results for input(s): INR, PROTIME in the last 168 hours.  Cardiac Enzymes: No results for input(s): CKTOTAL, CKMB, CKMBINDEX, TROPONINI in the last 168 hours.  HbA1C: No results found for: HGBA1C  CBG: Recent Labs  Lab 05/23/20 0241  GLUCAP 139*    Review of Systems:   Unable as patient is encephalopathic  Past Medical History  He,  has a past medical history of H/O ETOH abuse and Hypertension.   Surgical History   History reviewed. No pertinent surgical history.   Social History   reports that he has been smoking. He has been smoking about 1.00 pack per day. He has never used smokeless tobacco. He reports current alcohol use. He reports previous drug use.   Family History   His family  history is not on file.   Allergies No Known Allergies   Home Medications  Prior to Admission medications   Medication Sig Start Date End Date Taking? Authorizing Provider  acetaminophen (TYLENOL) 325 MG tablet Take 2 tablets (650 mg total) by mouth every 6 (six) hours as needed for mild pain or headache (fever >/= 101). 11/21/19  Yes Lonia Blood, MD  albuterol (VENTOLIN HFA) 108 (90 Base) MCG/ACT inhaler Inhale 2 puffs into the lungs every 6 (six) hours as needed for wheezing or shortness of breath. 04/18/20  Yes Pashayan, Mardelle Matte, MD  diclofenac Sodium (VOLTAREN) 1 % GEL Apply 4 g topically 4 (four) times daily as needed (right knee pain).   Yes [provider]  hydrALAZINE (APRESOLINE) 100 MG tablet Take 1 tablet (100 mg total) by  mouth every 8 (eight) hours. 11/21/19  Yes Lonia Blood, MD  ibuprofen (ADVIL) 200 MG tablet Take 400 mg by mouth every 6 (six) hours as needed for moderate pain.   Yes [provider]  losartan (COZAAR) 25 MG tablet Take 1 tablet (25 mg total) by mouth daily. 04/19/20  Yes Pashayan, Mardelle Matte, MD  metoprolol tartrate (LOPRESSOR) 25 MG tablet Take 0.5 tablets (12.5 mg total) by mouth 2 (two) times daily. 11/21/19  Yes Lonia Blood, MD  umeclidinium bromide (INCRUSE ELLIPTA) 62.5 MCG/INH AEPB Inhale 1 puff into the lungs daily. 04/18/20  Yes Pashayan, Mardelle Matte, MD  isosorbide dinitrate (ISORDIL) 30 MG tablet Take 1 tablet (30 mg total) by mouth 3 (three) times daily. Patient not taking: Reported on 05/22/2020 11/21/19   Lonia Blood, MD  predniSONE (STERAPRED UNI-PAK 21 TAB) 10 MG (21) TBPK tablet Take 6 tabs (60mg ) day 1, 5 tabs (50mg ) day 2, 4 tabs (40mg ) day 3, 3 tabs (30mg ) day 4, 2 tabs (20mg ) day 5, and 1 tab (10mg ) day 6. Patient not taking: Reported on 05/22/2020 04/19/20   , PA-C     Critical care time: 45 minutes     , AGACNP-BC [facility] Pulmonary/Critical Care  See for personal pager PCCM on call pager [phone]  05/23/2020 4:00 AM

## 2020-05-23 NOTE — Progress Notes (Signed)
PCCM INTERVAL PROGRESS NOTE  Upon arrival to ICU the patient was desaturating to the low 80s and was obstructing his airway. Lethargy from ativan/precedex + likely OSA.  Post intubation film with pulmonary edema.   Plan; Intubate patient to protect airway and allow ongoing workup including CT head Precedex for RASS goal Full vent support ABG VAP bundle Lasix 40mg  now    , AGACNP-BC [facility] Pulmonary/Critical Care  See Amion for personal pager PCCM on call pager [phone]  05/23/2020 4:36 AM

## 2020-05-23 NOTE — Plan of Care (Signed)
  Problem: Health Behavior/Discharge Planning: Goal: Ability to manage health-related needs will improve Outcome: Progressing   Problem: Clinical Measurements: Goal: Ability to maintain clinical measurements within normal limits will improve Outcome: Progressing   Problem: Clinical Measurements: Goal: Respiratory complications will improve Outcome: Progressing   Problem: Activity: Goal: Risk for activity intolerance will decrease Outcome: Progressing   Problem: Coping: Goal: Level of anxiety will decrease Outcome: Progressing   Problem: Pain Managment: Goal: General experience of comfort will improve Outcome: Progressing

## 2020-05-24 ENCOUNTER — Inpatient Hospital Stay (HOSPITAL_COMMUNITY): Payer: Medicare Other

## 2020-05-24 DIAGNOSIS — J9601 Acute respiratory failure with hypoxia: Secondary | ICD-10-CM

## 2020-05-24 LAB — COMPREHENSIVE METABOLIC PANEL WITH GFR
ALT: 18 U/L (ref 0–44)
AST: 24 U/L (ref 15–41)
Albumin: 3.1 g/dL — ABNORMAL LOW (ref 3.5–5.0)
Alkaline Phosphatase: 56 U/L (ref 38–126)
Anion gap: 13 (ref 5–15)
BUN: 18 mg/dL (ref 8–23)
CO2: 27 mmol/L (ref 22–32)
Calcium: 9.3 mg/dL (ref 8.9–10.3)
Chloride: 99 mmol/L (ref 98–111)
Creatinine, Ser: 1.11 mg/dL (ref 0.61–1.24)
GFR calc Af Amer: >60 mL/min
GFR calc non Af Amer: >60 mL/min
Glucose, Bld: 91 mg/dL (ref 70–99)
Potassium: 4.1 mmol/L (ref 3.5–5.1)
Sodium: 139 mmol/L (ref 135–145)
Total Bilirubin: 0.8 mg/dL (ref 0.3–1.2)
Total Protein: 6.7 g/dL (ref 6.5–8.1)

## 2020-05-24 LAB — MAGNESIUM
Magnesium: 2.6 mg/dL — ABNORMAL HIGH (ref 1.7–2.4)
Magnesium: 2.4 mg/dL (ref 1.7–2.4)

## 2020-05-24 LAB — GLUCOSE, CAPILLARY
Glucose-Capillary: 100 mg/dL — ABNORMAL HIGH (ref 70–99)
Glucose-Capillary: 105 mg/dL — ABNORMAL HIGH (ref 70–99)
Glucose-Capillary: 107 mg/dL — ABNORMAL HIGH (ref 70–99)
Glucose-Capillary: 116 mg/dL — ABNORMAL HIGH (ref 70–99)
Glucose-Capillary: 82 mg/dL (ref 70–99)
Glucose-Capillary: 95 mg/dL (ref 70–99)

## 2020-05-24 LAB — CBC
HCT: 46 % (ref 39.0–52.0)
Hemoglobin: 14.7 g/dL (ref 13.0–17.0)
MCH: 30.6 pg (ref 26.0–34.0)
MCHC: 32 g/dL (ref 30.0–36.0)
MCV: 95.8 fL (ref 80.0–100.0)
Platelets: 292 10*3/uL (ref 150–400)
RBC: 4.8 MIL/uL (ref 4.22–5.81)
RDW: 16.2 % — ABNORMAL HIGH (ref 11.5–15.5)
WBC: 12.9 10*3/uL — ABNORMAL HIGH (ref 4.0–10.5)
nRBC: 0 % (ref 0.0–0.2)

## 2020-05-24 LAB — PHOSPHORUS
Phosphorus: 4.9 mg/dL — ABNORMAL HIGH (ref 2.5–4.6)
Phosphorus: 5.4 mg/dL — ABNORMAL HIGH (ref 2.5–4.6)

## 2020-05-24 MED ORDER — LOSARTAN POTASSIUM 25 MG PO TABS
25.0000 mg | ORAL_TABLET | Freq: Every day | ORAL | Status: DC
  Administered 2020-05-24: 25 mg
  Filled 2020-05-24 (×2): qty 1

## 2020-05-24 MED ORDER — IPRATROPIUM-ALBUTEROL 0.5-2.5 (3) MG/3ML IN SOLN: 3.0000 mL | RESPIRATORY_TRACT | Status: DC | PRN

## 2020-05-24 MED ORDER — METOPROLOL TARTRATE 5 MG/5ML IV SOLN
2.5000 mg | INTRAVENOUS | Status: DC | PRN
  Administered 2020-05-27: 2.5 mg via INTRAVENOUS
  Filled 2020-05-24 (×3): qty 5

## 2020-05-24 MED ORDER — ISOSORBIDE DINITRATE 30 MG PO TABS
30.0000 mg | ORAL_TABLET | Freq: Three times a day (TID) | ORAL | Status: DC
  Administered 2020-05-24 – 2020-05-25 (×6): 30 mg
  Filled 2020-05-24 (×7): qty 1

## 2020-05-24 MED ORDER — AMLODIPINE BESYLATE 5 MG PO TABS
5.0000 mg | ORAL_TABLET | Freq: Every day | ORAL | Status: DC
  Administered 2020-05-24 – 2020-05-25 (×2): 5 mg
  Filled 2020-05-24 (×2): qty 1

## 2020-05-24 NOTE — Progress Notes (Signed)
OT Cancellation Note  Patient Details Name: William Pruitt MRN: [id number] DOB: 06/06/1956   Cancelled Treatment:    Reason Eval/Treat Not Completed: Patient not medically ready (sedation due to agitation)  Wynona Neat, OTR/L  Acute Rehabilitation Services Pager: [phone] Office: [phone] .  05/24/2020, 8:20 AM

## 2020-05-24 NOTE — Progress Notes (Addendum)
NAME:  William Pruitt, MRN:  [id number], DOB:  08/07/1956, LOS: 2 ADMISSION DATE:  05/21/2020, CONSULTATION DATE:  8/31 REFERRING MD:  Dr. Katrinka Blazing TRH, CHIEF COMPLAINT:  ETOH withdrawal  Brief History   64 year old male admitted 8/29 for COPD exacerbation who developed agitation felt secondary to alcohol withdrawal.  PCCM called after failed improvement with 10 total milligrams of Ativan over the course of the shift.  Recently admitted to [facility] on 7/23 for COPD exacerbation and hypertensive crisis.  He was treated for COPD with steroids, bronchodilators, and azithromycin.  It was during this admission he developed his oxygen requirement was discharged on 3 L.  Hypertension was managed with oral agents.  He has been unable to be completely compliant with his oxygen because of his living situation.    On 8/30 he called EMS due to shortness of breath x24 hours.  He does reportedly continue to smoke and drink.   He was also tachypneic.  Blood pressure is again elevated with systolics greater than 200.  He was also hypoxic requiring up to 6 L with activity.  Chest x-ray non-acute.  He was admitted to the hospitalist service and treated for acute exacerbation of COPD with duo nebs, steroids, and azithromycin.  He was treated for hypertensive urgency with resumption of his home antihypertensive regimen including metoprolol, losartan, and hydralazine.  Later that evening the patient became progressively delirious and agitated.  At times he was getting out of bed and was combative with staff.  He was started on CIWA protocol for what was felt to be alcohol withdrawal, however, despite multiple doses of Ativan he remained profoundly agitated.  Around 2 AM while out of bed he fell from standing and reportedly struck his head on the sink in his room.  After a total of 10 mg of Ativan in an 8-hour period PCCM was consulted for consideration of Precedex infusion and ICU transfer.  Past Medical History  ETOH  abuse Tobacco Abuse   HTN Homelessness - living most recently at a shelter  Significant Hospital Events   8/30 Admit 8/31 worsening agitation transferred to ICU  Consults:    Procedures:  ETT 8/31 >>   Significant Diagnostic Tests:  CT head 8/31 >> negative, atrophy and chronic small vessel disease  Micro Data:  COVID 8/29 >> negative   Antimicrobials:  Azithromycin 8/30 >>  Interim history/subjective:  Remains on propfol, cleviprex weaned off  On vent, PSV wean Afebrile 97.6 / WBC 12.9 I/O UOP 3.8L, -2.4L in last 24 hours RN reports pt gets agitated when sedation interrupted  Objective   Blood pressure (!) 163/105, pulse 77, temperature 97.6 F (36.4 C), temperature source Oral, resp. rate 19, weight 116.5 kg, SpO2 95 %.    Vent Mode: PRVC FiO2 (%):  [40 %-50 %] 40 % Set Rate:  [16 bmp] 16 bmp Vt Set:  [650 mL] 650 mL PEEP:  [5 cmH20] 5 cmH20 Plateau Pressure:  [17 cmH20-27 cmH20] 21 cmH20   Intake/Output Summary (Last 24 hours) at 05/24/2020 1012 Last data filed at 05/24/2020 0900 Gross per 24 hour  Intake 1299.99 ml  Output 2595 ml  Net -1295.01 ml   Filed Weights   05/24/20 0326  Weight: 116.5 kg    Examination: General: adult male lying in bed on vent in NAD   HEENT: MM pink/moist, ETT, anicteric  Neuro: sedate CV: s1s2 RRR, no m/r/g PULM:  Non-labored on vent, lungs bilaterally clear  GI: soft, bsx4 active  Extremities: warm/dry, 1-2+ BLE pre-tibial pitting edema with changes consistent with venous stasis  Skin: no rashes or lesions  Resolved Hospital Problem list     Assessment & Plan:   Acute agitated delirium ETOH Withdrawal  Known hx of alcohol abuse and his symptoms are certainly consistent with alcohol withdrawal and delirium tremens, however, he was drinking alcohol as recently as today and was even caught with some while inpatient. So this may not necessarily fit the timeline for alcohol withdrawal. He did fall and strike his head but  CT head negative which is reassuring. Also considering profound hypertension his symptoms are also consistent with hypertensive encephalopathy or PRES -continue propofol for sedation  -PRN ativan for seizure / evidence of withdrawal  -BP control  -follow neuro exam closely -consider MRI if no improvement with BP control   Hypertensive Crisis Second admission for this in the past month. Systolic blood pressure greater than 200 on admission and remains as such. -stop cleviprex -continue lopressor, hydralazine -resume home losartan, isordil  -add norvasc  -tele montioring  Acute on chronic hypoxemic respiratory failure Oxygen 3 L as an outpatient since July. Acute exacerbation of COPD -PRVC 8cc/kg  -wean PEEP / FiO2 for sats 88-94% -continue brovana, pulmicort  -change duoneb to PRN -follow intermittent CXR  -add stop date to solumedrol  Acute on chronic heart failure with preserved ejection fraction -BP control as above  -follow I/O's, daily weights -consider repeat lasix 9/2  Alcohol abuse Tobacco abuse -continue folate, thiamine, MVI  -substance abuse counseling when off vent   Best practice:  Diet: NPO / TF Pain/Anxiety/Delirium protocol (if indicated): Propofol + PRN fentanyl, ativan  VAP protocol (if indicated): in place DVT prophylaxis: Lovenox GI prophylaxis: PPI Glucose control: NA Mobility: Bedrest Code Status: Full code Family Communication: Brother (Tim), updated via phone 8/31.  Message left for return call 9/1.  Tim indicated he was working today.  Will follow up.  Disposition: ICU  Labs   CBC: Recent Labs  Lab 05/21/20 1704 05/23/20 0442 05/23/20 0535 05/24/20 0443  WBC 7.9 12.5*  --  12.9*  HGB 13.3 12.9* 12.9* 14.7  HCT 42.1 40.0 38.0* 46.0  MCV 96.3 97.1  --  95.8  PLT 356 341  --  292    Basic Metabolic Panel: Recent Labs  Lab 05/21/20 1704 05/23/20 0442 05/23/20 0535 05/23/20 1648 05/24/20 0443  NA 134* 136 135  --  139  K 4.2  4.6 4.7  --  4.1  CL 99 99  --   --  99  CO2 25 27  --   --  27  GLUCOSE 104* 133*  --   --  91  BUN 8 15  --   --  18  CREATININE 1.16 1.22  --   --  1.11  CALCIUM 8.7* 8.9  --   --  9.3  MG  --  2.4  --  2.5* 2.6*  PHOS  --  5.3*  --  4.2 4.9*   GFR: Estimated Creatinine Clearance: 90 mL/min (by C-G formula based on SCr of 1.11 mg/dL). Recent Labs  Lab 05/21/20 1704 05/23/20 0442 05/24/20 0443  WBC 7.9 12.5* 12.9*    Liver Function Tests: Recent Labs  Lab 05/24/20 0443  AST 24  ALT 18  ALKPHOS 56  BILITOT 0.8  PROT 6.7  ALBUMIN 3.1*   No results for input(s): LIPASE, AMYLASE in the last 168 hours. No results for input(s): AMMONIA in the last 168 hours.  ABG    Component Value Date/Time   PHART 7.368 05/23/2020 0535   PCO2ART 55.6 (H) 05/23/2020 0535   PO2ART 213 (H) 05/23/2020 0535   HCO3 32.4 (H) 05/23/2020 0535   TCO2 34 (H) 05/23/2020 0535   O2SAT 100.0 05/23/2020 0535     Coagulation Profile: No results for input(s): INR, PROTIME in the last 168 hours.  Cardiac Enzymes: No results for input(s): CKTOTAL, CKMB, CKMBINDEX, TROPONINI in the last 168 hours.  HbA1C: No results found for: HGBA1C  CBG: Recent Labs  Lab 05/23/20 1536 05/23/20 1950 05/23/20 2319 05/24/20 0323 05/24/20 0741  GLUCAP 136* 157* 123* 107* 82     Critical care time: 32 minutes     Canary Brim, MSN, NP-C [facility] 05/24/2020, 10:12 AM   Please see [email or web address] for pager details.

## 2020-05-24 NOTE — Progress Notes (Signed)
PT Cancellation Note  Patient Details Name: LUCKY TROTTA MRN: [id number] DOB: [birth year]   Cancelled Treatment:    Reason Eval/Treat Not Completed: Medical issues which prohibited therapy.  Sedation due to withdrawal and agitation. 05/24/2020  Jacinto Halim., PT Acute Rehabilitation Services [phone]  (pager) [phone]  (office)   Eliseo Gum Haywood Meinders 05/24/2020, 10:08 AM

## 2020-05-25 ENCOUNTER — Inpatient Hospital Stay (HOSPITAL_COMMUNITY): Payer: Medicare Other

## 2020-05-25 LAB — BASIC METABOLIC PANEL
Anion gap: 12 (ref 5–15)
BUN: 29 mg/dL — ABNORMAL HIGH (ref 8–23)
CO2: 26 mmol/L (ref 22–32)
Calcium: 9.1 mg/dL (ref 8.9–10.3)
Chloride: 101 mmol/L (ref 98–111)
Creatinine, Ser: 1.38 mg/dL — ABNORMAL HIGH (ref 0.61–1.24)
GFR calc Af Amer: >60 mL/min (ref >60)
GFR calc non Af Amer: 53 mL/min — ABNORMAL LOW (ref >60)
Glucose, Bld: 102 mg/dL — ABNORMAL HIGH (ref 70–99)
Potassium: 4.1 mmol/L (ref 3.5–5.1)
Sodium: 139 mmol/L (ref 135–145)

## 2020-05-25 LAB — GLUCOSE, CAPILLARY
Glucose-Capillary: 99 mg/dL (ref 70–99)
Glucose-Capillary: 106 mg/dL — ABNORMAL HIGH (ref 70–99)
Glucose-Capillary: 103 mg/dL — ABNORMAL HIGH (ref 70–99)
Glucose-Capillary: 95 mg/dL (ref 70–99)
Glucose-Capillary: 110 mg/dL — ABNORMAL HIGH (ref 70–99)
Glucose-Capillary: 92 mg/dL (ref 70–99)

## 2020-05-25 LAB — CBC
HCT: 43.1 % (ref 39.0–52.0)
Hemoglobin: 13.8 g/dL (ref 13.0–17.0)
MCH: 31.4 pg (ref 26.0–34.0)
MCHC: 32 g/dL (ref 30.0–36.0)
MCV: 98 fL (ref 80.0–100.0)
Platelets: 311 10*3/uL (ref 150–400)
RBC: 4.4 MIL/uL (ref 4.22–5.81)
RDW: 16.4 % — ABNORMAL HIGH (ref 11.5–15.5)
WBC: 10.6 10*3/uL — ABNORMAL HIGH (ref 4.0–10.5)
nRBC: 0 % (ref 0.0–0.2)

## 2020-05-25 MED ORDER — AMLODIPINE BESYLATE 10 MG PO TABS
10.0000 mg | ORAL_TABLET | Freq: Every day | ORAL | Status: DC
  Filled 2020-05-25: qty 1

## 2020-05-25 MED ORDER — IPRATROPIUM BROMIDE 0.02 % IN SOLN
0.5000 mg | Freq: Four times a day (QID) | RESPIRATORY_TRACT | Status: DC
  Administered 2020-05-25 – 2020-05-26 (×5): 0.5 mg via RESPIRATORY_TRACT
  Filled 2020-05-25 (×5): qty 2.5

## 2020-05-25 MED ORDER — DEXMEDETOMIDINE HCL IN NACL 400 MCG/100ML IV SOLN
0.2000 ug/kg/h | INTRAVENOUS | Status: DC
  Administered 2020-05-25: 0.2 ug/kg/h via INTRAVENOUS
  Administered 2020-05-26: 0.5 ug/kg/h via INTRAVENOUS
  Filled 2020-05-25 (×2): qty 100

## 2020-05-25 MED ORDER — AMLODIPINE BESYLATE 5 MG PO TABS
5.0000 mg | ORAL_TABLET | Freq: Once | ORAL | Status: AC
  Filled 2020-05-25: qty 1

## 2020-05-25 NOTE — Procedures (Signed)
Extubation Procedure Note  Patient Details:   Name: William Pruitt DOB: 09-25-1955 MRN: [id number]   Airway Documentation:    Vent end date: 05/25/20 Vent end time: 1226   Evaluation  O2 sats: stable throughout Complications: No apparent complications Patient did tolerate procedure well. Bilateral Breath Sounds: Rhonchi   Pt extubated to 5L [place] per MD order. Pt had positive cuff leak and no stridor noted. Pt able to voice his name.   [name] 05/25/2020, 12:27 PM

## 2020-05-25 NOTE — Progress Notes (Signed)
eLink Physician-Brief Progress Note Patient Name: William Pruitt DOB: 1955-12-02 MRN: [id number]   Date of Service  05/25/2020  HPI/Events of Note  Agitation - d/t ETOH withdrawal. Already on Thiamine.   eICU Interventions  Plan: 1. Low dose Precedex IV infusion (0.2-0.5 mcg/kg/hour). Titrate to RASS = 0.      Intervention Category Major Interventions: Delirium, psychosis, severe agitation - evaluation and management  [name] 05/25/2020, 9:07 PM

## 2020-05-25 NOTE — Evaluation (Signed)
Occupational Therapy Evaluation Patient Details Name: William Pruitt MRN: [id number] DOB: 1956-08-17 Today's Date: 05/25/2020    History of Present Illness Pt is a 64 year old man admitted 05/22/20 with acute on chronic hypoxic respiratory failure. He became increasing combative due to alcohol withdrawal, fell and struck his head (CT negative for acute changes), and was intubated for airway protection on 05/23/20. Pt on CIWA precautions. PMH: COPD on 3L 02 at baseline, alcohol abuse, smoker and non compliance with 02 and inhaled therapies due to homelessness..   Clinical Impression   Pt was independent prior to admission. Presents with lethargy with sedation recently discontinued, generalized weakness and poor sitting balance. He follows simple commands with increased time. Pt is currently dependent in all ADL and requires +2 assist for bed level mobility. Recommending SNF. Will follow acutely.    Follow Up Recommendations  SNF;Supervision/Assistance - 24 hour    Equipment Recommendations  Other (comment) (defer to next venue)    Recommendations for Other Services       Precautions / Restrictions Precautions Precautions: Fall Precaution Comments: weaning from vent      Mobility Bed Mobility Overal bed mobility: Needs Assistance Bed Mobility: Supine to Sit;Sit to Supine     Supine to sit: +2 for physical assistance;Total assist Sit to supine: Total assist;+2 for physical assistance   General bed mobility comments: assist for all aspects  Transfers                 General transfer comment: deferred    Balance Overall balance assessment: Needs assistance Sitting-balance support: Feet supported Sitting balance-Leahy Scale: Poor Sitting balance - Comments: L side lean                                   ADL either performed or assessed with clinical judgement   ADL                                         General ADL Comments: dependent      Vision   Additional Comments: opening eyes upon command, unable to assess acuity     Perception     Praxis      Pertinent Vitals/Pain Pain Assessment: Faces Faces Pain Scale: No hurt     Hand Dominance Right   Extremity/Trunk Assessment Upper Extremity Assessment Upper Extremity Assessment: Difficult to assess due to impaired cognition   Lower Extremity Assessment Lower Extremity Assessment: Defer to PT evaluation   Cervical / Trunk Assessment Cervical / Trunk Assessment: Other exceptions Cervical / Trunk Exceptions: weakness   Communication Communication Communication: Other (comment) (intubated)   Cognition Arousal/Alertness: Lethargic Behavior During Therapy: Flat affect;Agitated Overall Cognitive Status: Difficult to assess                                 General Comments: following commands with increased time   General Comments       Exercises     Shoulder Instructions      Home Living Family/patient expects to be discharged to:: Shelter/Homeless  Prior Functioning/Environment Level of Independence: Independent                 OT Problem List: Decreased strength;Decreased activity tolerance;Impaired balance (sitting and/or standing);Decreased coordination;Decreased cognition;Decreased safety awareness;Impaired UE functional use;Cardiopulmonary status limiting activity      OT Treatment/Interventions: Self-care/ADL training;DME and/or AE instruction;Cognitive remediation/compensation;Patient/family education;Balance training;Therapeutic activities    OT Goals(Current goals can be found in the care plan section) Acute Rehab OT Goals OT Goal Formulation: Patient unable to participate in goal setting Time For Goal Achievement: 06/08/20 Potential to Achieve Goals: Good ADL Goals Pt Will Perform Eating: with set-up;sitting Pt Will Perform Grooming: with  supervision;standing Pt Will Perform Upper Body Dressing: with set-up;sitting Pt Will Perform Lower Body Dressing: with supervision;sit to/from stand Pt Will Transfer to Toilet: with supervision;ambulating;regular height toilet Pt Will Perform Toileting - Clothing Manipulation and hygiene: with supervision;sit to/from stand Additional ADL Goal #1: Pt will be oriented x 4.  OT Frequency: Min 2X/week   Barriers to D/C: Decreased caregiver support          Co-evaluation PT/OT/SLP Co-Evaluation/Treatment: Yes Reason for Co-Treatment: Complexity of the patient's impairments (multi-system involvement);Necessary to address cognition/behavior during functional activity;For patient/therapist safety   OT goals addressed during session: Strengthening/ROM      AM-PAC OT "6 Clicks" Daily Activity     Outcome Measure Help from another person eating meals?: Total Help from another person taking care of personal grooming?: Total Help from another person toileting, which includes using toliet, bedpan, or urinal?: Total Help from another person bathing (including washing, rinsing, drying)?: Total Help from another person to put on and taking off regular upper body clothing?: Total Help from another person to put on and taking off regular lower body clothing?: Total 6 Click Score: 6   End of Session Equipment Utilized During Treatment: Other (comment) (vent) Nurse Communication: Mobility status  Activity Tolerance: Patient tolerated treatment well Patient left: in bed;with call bell/phone within reach;with bed alarm set;with restraints reapplied  OT Visit Diagnosis: Other symptoms and signs involving cognitive function;Muscle weakness (generalized) (M62.81);History of falling (Z91.81)                Time: 2878-6767 OT Time Calculation (min): 25 min Charges:  OT General Charges $OT Visit: 1 Visit OT Evaluation $OT Eval High Complexity: 1 High  William Pruitt, OTR/L Acute Rehabilitation  Services Pager: [phone] Office: [phone]  Evern Bio 05/25/2020, 12:44 PM

## 2020-05-25 NOTE — Progress Notes (Signed)
NAME:  William Pruitt, MRN:  [id number], DOB:  12-Jan-1956, LOS: 3 ADMISSION DATE:  05/21/2020, CONSULTATION DATE:  8/31 REFERRING MD:  Dr. Katrinka Blazing TRH, CHIEF COMPLAINT:  ETOH withdrawal  Brief History   64 year old male admitted 8/29 for COPD exacerbation who developed agitation felt secondary to alcohol withdrawal.  PCCM called after failed improvement with 10 total milligrams of Ativan over the course of the shift.  Recently admitted to [facility] on 7/23 for COPD exacerbation and hypertensive crisis.  He was treated for COPD with steroids, bronchodilators, and azithromycin.  It was during this admission he developed his oxygen requirement was discharged on 3 L.  Hypertension was managed with oral agents.  He has been unable to be completely compliant with his oxygen because of his living situation.    On 8/30 he called EMS due to shortness of breath x24 hours.  He does reportedly continue to smoke and drink.   He was also tachypneic.  Blood pressure is again elevated with systolics greater than 200.  He was also hypoxic requiring up to 6 L with activity.  Chest x-ray non-acute.  He was admitted to the hospitalist service and treated for acute exacerbation of COPD with duo nebs, steroids, and azithromycin.  He was treated for hypertensive urgency with resumption of his home antihypertensive regimen including metoprolol, losartan, and hydralazine.  Later that evening the patient became progressively delirious and agitated.  At times he was getting out of bed and was combative with staff.  He was started on CIWA protocol for what was felt to be alcohol withdrawal, however, despite multiple doses of Ativan he remained profoundly agitated.  Around 2 AM while out of bed he fell from standing and reportedly struck his head on the sink in his room.  After a total of 10 mg of Ativan in an 8-hour period PCCM was consulted for consideration of Precedex infusion and ICU transfer.  Past Medical History  ETOH  abuse Tobacco Abuse   HTN Homelessness - living most recently at a shelter  Significant Hospital Events   8/30 Admit 8/31 worsening agitation transferred to ICU  Consults:    Procedures:  ETT 8/31 >>   Significant Diagnostic Tests:  CT head 8/31 >> negative, atrophy and chronic small vessel disease  Micro Data:  COVID 8/29 >> negative   Antimicrobials:  Azithromycin 8/30 >>  Interim history/subjective:  Holding propofol along with minimal vent settings, pt more responsive and able to follow some commands.  On vent, PSV wean Afebrile 98.6 / WBC 10.6 Intake 1.02L, Output 1.18L, Net -0.16L in last 24 hours RN reports pt gets agitated when sedation interrupted  Objective   Blood pressure 118/80, pulse 79, temperature 98.6 F (37 C), temperature source Axillary, resp. rate 20, weight 118.4 kg, SpO2 96 %.    Vent Mode: PSV;CPAP FiO2 (%):  [40 %] 40 % Set Rate:  [16 bmp] 16 bmp Vt Set:  [600 mL] 600 mL PEEP:  [5 cmH20] 5 cmH20 Pressure Support:  [5 cmH20-10 cmH20] 5 cmH20 Plateau Pressure:  [21 cmH20] 21 cmH20   Intake/Output Summary (Last 24 hours) at 05/25/2020 1058 Last data filed at 05/25/2020 0900 Gross per 24 hour  Intake 1017.64 ml  Output 1335 ml  Net -317.36 ml   Filed Weights   05/24/20 0326 05/25/20 0500  Weight: 116.5 kg 118.4 kg    Examination: General: Obese male lying in bed on vent in NAD   HEENT: mucous membranes pink and  moist, ETT in place, increased oral secretions, anicteric. Neuro: patient more responsive after stopping sedation, opens eyes to voice, able to follow simple commands, able to move extremities but unable to fully lift head off of bed. CV: Normal rate and regular rhythm, s1s2 hear, no m/r/g. PULM:  Non-labored breathing on vent, lungs CTABL. GI: Slightly firm abdomen, non-distended, non-tender, +BS. Extremities: warm/dry, 1+ edema in b/l LE.   Skin: No rashes or lesion.  Resolved Hospital Problem list     Assessment & Plan:    Acute on chronic hypoxemic respiratory failure Acute exacerbation of COPD Oxygen 3 L as an outpatient since July. -PRVC with PEEP of 5 -wean PEEP / FiO2 for sats 88-92% (chronic COPD) -decreased FiO2 to 30% -continue scheduled brovana -start scheduled atrovant -continue duonebs prn -DC'd pulmicort as patient already receiving solumedrol IV -on day 3 of IV solumedrol -CXR unchanged from yesterday; continue monitoring intermittent CXRs -continue azithromycin (day 3 of 5) -will reassess patient in afternoon with anticipation of extubation today  Encephalopathy, likely multifactorial Hypertensive crisis  Acute delirium ETOH withdrawal  -holding propofol to assess pt off of sedation, tolerating well -will reassess in the afternoon, anticipate trial of extubation today -holding tube feeds -DC'd ativan -BP control -frequent neuro exams -consider MRI if no improvement with BP control   Hypertensive Crisis Second admission for this in the past month. Systolic blood pressure greater than 200 on admission, now staying 100s-120s with anti-HTN meds. -on norvasc and home hydralazine, lopressor, and isordil -holding home losartan for now due to AKI -tele montioring  Mild AKI No clear baseline Cr, however likely around 0.94-1. On 09/02, Cr 1.38. -may be 2/2 losartan so will hold losartan for now -will hold off on fluids as this time as CXR suggestive of mild pulm edema and pt slightly fluid overloaded on exam  G1DD with preserved ejection fraction Seen on recent ECHO (7/23), EF 65-70%. -BP control as above  -follow I/O's, daily weights -will hold off on lasix at this time  Alcohol abuse Tobacco abuse -continue folate, thiamine, MVI  -substance abuse counseling when off vent   Best practice:  Diet: NPO / TF Pain/Anxiety/Delirium protocol (if indicated): Propofol + PRN fentanyl VAP protocol (if indicated): in place DVT prophylaxis: Lovenox GI prophylaxis: PPI Glucose control:  NA Mobility: Bedrest Code Status: Full code Family Communication: Brother (Tim), updated via phone 8/31.   Disposition: ICU  Labs   CBC: Recent Labs  Lab 05/21/20 1704 05/23/20 0442 05/23/20 0535 05/24/20 0443 05/25/20 0207  WBC 7.9 12.5*  --  12.9* 10.6*  HGB 13.3 12.9* 12.9* 14.7 13.8  HCT 42.1 40.0 38.0* 46.0 43.1  MCV 96.3 97.1  --  95.8 98.0  PLT 356 341  --  292 311    Basic Metabolic Panel: Recent Labs  Lab 05/21/20 1704 05/23/20 0442 05/23/20 0535 05/23/20 1648 05/24/20 0443 05/24/20 1836 05/25/20 0207  NA 134* 136 135  --  139  --  139  K 4.2 4.6 4.7  --  4.1  --  4.1  CL 99 99  --   --  99  --  101  CO2 25 27  --   --  27  --  26  GLUCOSE 104* 133*  --   --  91  --  102*  BUN 8 15  --   --  18  --  29*  CREATININE 1.16 1.22  --   --  1.11  --  1.38*  CALCIUM 8.7*  8.9  --   --  9.3  --  9.1  MG  --  2.4  --  2.5* 2.6* 2.4  --   PHOS  --  5.3*  --  4.2 4.9* 5.4*  --    GFR: Estimated Creatinine Clearance: 73 mL/min (A) (by C-G formula based on SCr of 1.38 mg/dL (H)). Recent Labs  Lab 05/21/20 1704 05/23/20 0442 05/24/20 0443 05/25/20 0207  WBC 7.9 12.5* 12.9* 10.6*    Liver Function Tests: Recent Labs  Lab 05/24/20 0443  AST 24  ALT 18  ALKPHOS 56  BILITOT 0.8  PROT 6.7  ALBUMIN 3.1*   No results for input(s): LIPASE, AMYLASE in the last 168 hours. No results for input(s): AMMONIA in the last 168 hours.  ABG    Component Value Date/Time   PHART 7.368 05/23/2020 0535   PCO2ART 55.6 (H) 05/23/2020 0535   PO2ART 213 (H) 05/23/2020 0535   HCO3 32.4 (H) 05/23/2020 0535   TCO2 34 (H) 05/23/2020 0535   O2SAT 100.0 05/23/2020 0535     Coagulation Profile: No results for input(s): INR, PROTIME in the last 168 hours.  Cardiac Enzymes: No results for input(s): CKTOTAL, CKMB, CKMBINDEX, TROPONINI in the last 168 hours.  HbA1C: No results found for: HGBA1C  CBG: Recent Labs  Lab 05/24/20 1521 05/24/20 1927 05/24/20 2334  05/25/20 0403 05/25/20 0725  GLUCAP 116* 100* 95 99 106*     Critical care time: 32 minutes     Merrilyn Puma, MD 05/25/2020, 10:58 AM Pager: [phone]

## 2020-05-25 NOTE — Evaluation (Signed)
Physical Therapy Evaluation Patient Details Name: William Pruitt MRN: [id number] DOB: [birth year] Today's Date: 05/25/2020   History of Present Illness  Pt is a 64 year old man admitted 05/22/20 with acute on chronic hypoxic respiratory failure. He became increasing combative due to alcohol withdrawal, fell and struck his head (CT negative for acute changes), and was intubated for airway protection on 05/23/20. Pt on CIWA precautions. PMH: COPD on 3L 02 at baseline, alcohol abuse, smoker and non compliance with 02 and inhaled therapies due to homelessness..  Clinical Impression  Pt admitted with/for Acute on Chronic Resp failure on CIWA for alcohol w/drawal.  Pt at a maximal assist of 2 persons for mobility at this time..  Pt currently limited functionally due to the problems listed. ( See problems list.)   Pt will benefit from PT to maximize function and safety in order to get ready for next venue listed below.     Follow Up Recommendations SNF;Supervision/Assistance - 24 hour    Equipment Recommendations  Other (comment) (TBD)    Recommendations for Other Services       Precautions / Restrictions Precautions Precautions: Fall Precaution Comments: weaning from vent      Mobility  Bed Mobility Overal bed mobility: Needs Assistance Bed Mobility: Supine to Sit;Sit to Supine     Supine to sit: +2 for physical assistance;Total assist Sit to supine: Total assist;+2 for physical assistance   General bed mobility comments: assist for all aspects  Transfers                 General transfer comment: deferred  Ambulation/Gait                Stairs            Wheelchair Mobility    Modified Rankin (Stroke Patients Only)       Balance Overall balance assessment: Needs assistance Sitting-balance support: Feet supported Sitting balance-Leahy Scale: Poor Sitting balance - Comments: L side lean                                     Pertinent  Vitals/Pain Pain Assessment: Faces Faces Pain Scale: No hurt    Home Living Family/patient expects to be discharged to:: Shelter/Homeless                      Prior Function Level of Independence: Independent               Hand Dominance   Dominant Hand: Right    Extremity/Trunk Assessment   Upper Extremity Assessment Upper Extremity Assessment: Defer to OT evaluation    Lower Extremity Assessment Lower Extremity Assessment: RLE deficits/detail;LLE deficits/detail    Cervical / Trunk Assessment Cervical / Trunk Assessment: Other exceptions Cervical / Trunk Exceptions: weakness  Communication   Communication: Other (comment) (intubated)  Cognition Arousal/Alertness: Lethargic Behavior During Therapy: Flat affect;Agitated Overall Cognitive Status: Difficult to assess                                 General Comments: following commands with increased time      General Comments General comments (skin integrity, edema, etc.): vss on 40% FiO2 PS/CPAP    Exercises     Assessment/Plan    PT Assessment Patient needs continued PT services  PT Problem List Decreased activity tolerance;Decreased  balance;Decreased mobility;Decreased coordination;Decreased cognition;Decreased safety awareness;Cardiopulmonary status limiting activity       PT Treatment Interventions DME instruction;Gait training;Functional mobility training;Therapeutic activities;Balance training;Patient/family education    PT Goals (Current goals can be found in the Care Plan section)  Acute Rehab PT Goals Patient Stated Goal: pt unable to participate in goal setting PT Goal Formulation: Patient unable to participate in goal setting Time For Goal Achievement: 06/08/20 Potential to Achieve Goals: Good    Frequency Min 2X/week   Barriers to discharge Decreased caregiver support (homeless)      Co-evaluation PT/OT/SLP Co-Evaluation/Treatment: Yes Reason for Co-Treatment:  Complexity of the patient's impairments (multi-system involvement) PT goals addressed during session: Mobility/safety with mobility         AM-PAC PT "6 Clicks" Mobility  Outcome Measure Help needed turning from your back to your side while in a flat bed without using bedrails?: Total Help needed moving from lying on your back to sitting on the side of a flat bed without using bedrails?: Total Help needed moving to and from a bed to a chair (including a wheelchair)?: Total Help needed standing up from a chair using your arms (e.g., wheelchair or bedside chair)?: Total Help needed to walk in hospital room?: Total Help needed climbing 3-5 steps with a railing? : Total 6 Click Score: 6    End of Session   Activity Tolerance: Patient tolerated treatment well Patient left: in bed;with call bell/phone within reach;with bed alarm set;with SCD's reapplied Nurse Communication: Mobility status PT Visit Diagnosis: Other abnormalities of gait and mobility (R26.89);Difficulty in walking, not elsewhere classified (R26.2)    Time: 7893-8101 PT Time Calculation (min) (ACUTE ONLY): 25 min   Charges:   PT Evaluation $PT Eval Moderate Complexity: 1 Mod          05/25/2020  Jacinto Halim., PT Acute Rehabilitation Services [phone]  (pager) [phone]  (office)  Eliseo Gum Pauline Trainer 05/25/2020, 6:43 PM

## 2020-05-26 ENCOUNTER — Inpatient Hospital Stay (HOSPITAL_COMMUNITY): Payer: Medicare Other

## 2020-05-26 LAB — CREATININE, URINE, RANDOM: Creatinine, Urine: 117.1 mg/dL

## 2020-05-26 LAB — CBC
HCT: 38.7 % — ABNORMAL LOW (ref 39.0–52.0)
Hemoglobin: 12.3 g/dL — ABNORMAL LOW (ref 13.0–17.0)
MCH: 31.4 pg (ref 26.0–34.0)
MCHC: 31.8 g/dL (ref 30.0–36.0)
MCV: 98.7 fL (ref 80.0–100.0)
Platelets: 271 10*3/uL (ref 150–400)
RBC: 3.92 MIL/uL — ABNORMAL LOW (ref 4.22–5.81)
RDW: 15.9 % — ABNORMAL HIGH (ref 11.5–15.5)
WBC: 8.6 10*3/uL (ref 4.0–10.5)
nRBC: 0 % (ref 0.0–0.2)

## 2020-05-26 LAB — GLUCOSE, CAPILLARY
Glucose-Capillary: 103 mg/dL — ABNORMAL HIGH (ref 70–99)
Glucose-Capillary: 114 mg/dL — ABNORMAL HIGH (ref 70–99)
Glucose-Capillary: 125 mg/dL — ABNORMAL HIGH (ref 70–99)
Glucose-Capillary: 73 mg/dL (ref 70–99)
Glucose-Capillary: 78 mg/dL (ref 70–99)
Glucose-Capillary: 83 mg/dL (ref 70–99)

## 2020-05-26 LAB — BASIC METABOLIC PANEL
Anion gap: 8 (ref 5–15)
BUN: 28 mg/dL — ABNORMAL HIGH (ref 8–23)
CO2: 27 mmol/L (ref 22–32)
Calcium: 8.6 mg/dL — ABNORMAL LOW (ref 8.9–10.3)
Chloride: 104 mmol/L (ref 98–111)
Creatinine, Ser: 1.44 mg/dL — ABNORMAL HIGH (ref 0.61–1.24)
GFR calc Af Amer: 59 mL/min — ABNORMAL LOW (ref >60)
GFR calc non Af Amer: 51 mL/min — ABNORMAL LOW (ref >60)
Glucose, Bld: 90 mg/dL (ref 70–99)
Potassium: 4.5 mmol/L (ref 3.5–5.1)
Sodium: 139 mmol/L (ref 135–145)

## 2020-05-26 LAB — SODIUM, URINE, RANDOM: Sodium, Ur: 66 mmol/L

## 2020-05-26 LAB — OSMOLALITY, URINE: Osmolality, Ur: 604 mOsm/kg (ref 300–900)

## 2020-05-26 MED ORDER — LORAZEPAM 2 MG/ML IJ SOLN
1.0000 mg | INTRAMUSCULAR | Status: DC | PRN
  Administered 2020-05-26: 1 mg via INTRAVENOUS
  Filled 2020-05-26 (×2): qty 1

## 2020-05-26 MED ORDER — HYDRALAZINE HCL 50 MG PO TABS
100.0000 mg | ORAL_TABLET | Freq: Three times a day (TID) | ORAL | Status: DC
  Administered 2020-05-26 – 2020-05-27 (×3): 100 mg via ORAL
  Filled 2020-05-26 (×3): qty 2

## 2020-05-26 MED ORDER — ADULT MULTIVITAMIN W/MINERALS CH
1.0000 | ORAL_TABLET | Freq: Every day | ORAL | Status: DC
  Administered 2020-05-26 – 2020-05-29 (×4): 1 via ORAL
  Filled 2020-05-26 (×3): qty 1

## 2020-05-26 MED ORDER — THIAMINE HCL 100 MG PO TABS
100.0000 mg | ORAL_TABLET | Freq: Every day | ORAL | Status: DC
  Administered 2020-05-27 – 2020-05-28 (×2): 100 mg via ORAL
  Filled 2020-05-26 (×2): qty 1

## 2020-05-26 MED ORDER — FOLIC ACID 1 MG PO TABS
1.0000 mg | ORAL_TABLET | Freq: Every day | ORAL | Status: DC
  Administered 2020-05-26 – 2020-05-29 (×4): 1 mg via ORAL
  Filled 2020-05-26 (×3): qty 1

## 2020-05-26 MED ORDER — AZITHROMYCIN 500 MG PO TABS
500.0000 mg | ORAL_TABLET | Freq: Every day | ORAL | Status: AC
  Administered 2020-05-26: 500 mg via ORAL
  Filled 2020-05-26: qty 1

## 2020-05-26 MED ORDER — ISOSORBIDE DINITRATE 30 MG PO TABS
30.0000 mg | ORAL_TABLET | Freq: Three times a day (TID) | ORAL | Status: DC
  Administered 2020-05-26 – 2020-05-27 (×4): 30 mg via ORAL
  Filled 2020-05-26 (×4): qty 1

## 2020-05-26 MED ORDER — AMLODIPINE BESYLATE 10 MG PO TABS
10.0000 mg | ORAL_TABLET | Freq: Every day | ORAL | Status: DC
  Administered 2020-05-26 – 2020-05-29 (×4): 10 mg via ORAL
  Filled 2020-05-26 (×3): qty 1

## 2020-05-26 MED ORDER — METOPROLOL TARTRATE 25 MG PO TABS
25.0000 mg | ORAL_TABLET | Freq: Two times a day (BID) | ORAL | Status: DC
  Administered 2020-05-26 – 2020-05-29 (×7): 25 mg via ORAL
  Filled 2020-05-26 (×4): qty 1
  Filled 2020-05-26 (×3): qty 2

## 2020-05-26 MED ORDER — ENSURE ENLIVE PO LIQD
237.0000 mL | Freq: Two times a day (BID) | ORAL | Status: DC
  Administered 2020-05-26 – 2020-05-29 (×6): 237 mL via ORAL

## 2020-05-26 NOTE — TOC Initial Note (Signed)
Transition of Care [facility]) - Initial/Assessment Note    Patient Details  Name: William Pruitt MRN: [id number] Date of Birth: 19-Dec-1955  Transition of Care [facility]) CM/SW Contact:    Inis Sizer, LCSW Phone Number: 05/26/2020, 12:13 PM  Clinical Narrative:                 CSW received consult for patient for SNF placement and homelessness. CSW spoke with patient at bedside to complete assessment. Patient reports he has never been to SNF before and is very hestitant to agree to go at discharge. Patient reports he has been at [facility] for a few months and feels safe there. Patient reports he receives some services from the [place] in [place]. Patient stated he did not want to forfeit his Medicaid check for long term placement. Patient requested additional time to consider being placed at [facility] for rehab. Patient reports he has some one child, that he hasn't spoken to in 20 years. Patient reports he has some family in [place] but is unsure if they will allow him to stay at their homes.  Expected Discharge Plan: Skilled Nursing Facility Barriers to Discharge: Continued Medical Work up, SNF Pending bed offer   Patient Goals and CMS Choice Patient states their goals for this hospitalization and ongoing recovery are:: "Go home"      Expected Discharge Plan and Services Expected Discharge Plan: Skilled Nursing Facility                                              Prior Living Arrangements/Services   Lives with:: Self Patient language and need for interpreter reviewed:: Yes Do you feel safe going back to the place where you live?: Yes      Need for Family Participation in Patient Care: Yes (Comment) Care giver support system in place?: No (comment) Current home services: Other (comment) (Oxygen) Criminal Activity/Legal Involvement Pertinent to Current Situation/Hospitalization: No - Comment as needed  Activities of Daily Living      Permission Sought/Granted                   Emotional Assessment Appearance:: Appears stated age Attitude/Demeanor/Rapport: Engaged Affect (typically observed): Apprehensive, Appropriate Orientation: : Oriented to Situation, Oriented to  Time, Oriented to Place, Oriented to Self Alcohol / Substance Use: Alcohol Use Psych Involvement: No (comment)  Admission diagnosis:  COPD exacerbation (HCC) [J44.1] Hypertensive urgency [I16.0] Patient Active Problem List   Diagnosis Date Noted  . Acute respiratory failure with hypoxia (HCC)   . Chronic respiratory failure with hypoxia (HCC) 05/22/2020  . Elevated troponin 05/22/2020  . Diastolic dysfunction 05/22/2020  . Homelessness 05/22/2020  . Tobacco abuse 05/22/2020  . Uncontrolled hypertension   . Dyspnea 04/14/2020  . Hypertensive urgency   . COPD exacerbation (HCC)   . ETOH abuse 11/14/2019  . Pneumonia due to COVID-19 virus 11/14/2019   PCP:  Patient, No Pcp Per Pharmacy:   [facility] [place], [place] - [address] [phone] [facility] [address] [facility] [place] [address] Phone: [phone] Fax: [phone]     Social Determinants of Health (SDOH) Interventions    Readmission Risk Interventions No flowsheet data found.

## 2020-05-26 NOTE — Progress Notes (Addendum)
NAME:  William Pruitt, MRN:  [id number], DOB:  [birth year], LOS: 4 ADMISSION DATE:  05/21/2020, CONSULTATION DATE:  8/31 REFERRING MD:  Dr. Katrinka Blazing TRH, CHIEF COMPLAINT:  ETOH withdrawal  Brief History   64 year old male admitted 8/29 for COPD exacerbation who developed agitation felt secondary to alcohol withdrawal.  PCCM called after failed improvement with 10 total milligrams of Ativan over the course of the shift.  Recently admitted to [facility] on 7/23 for COPD exacerbation and hypertensive crisis.  He was treated for COPD with steroids, bronchodilators, and azithromycin.  It was during this admission he developed his oxygen requirement was discharged on 3 L.  Hypertension was managed with oral agents.  He has been unable to be completely compliant with his oxygen because of his living situation.    On 8/30 he called EMS due to shortness of breath x24 hours.  He does reportedly continue to smoke and drink.   He was also tachypneic.  Blood pressure is again elevated with systolics greater than 200.  He was also hypoxic requiring up to 6 L with activity.  Chest x-ray non-acute.  He was admitted to the hospitalist service and treated for acute exacerbation of COPD with duo nebs, steroids, and azithromycin.  He was treated for hypertensive urgency with resumption of his home antihypertensive regimen including metoprolol, losartan, and hydralazine.  Later that evening the patient became progressively delirious and agitated.  At times he was getting out of bed and was combative with staff.  He was started on CIWA protocol for what was felt to be alcohol withdrawal, however, despite multiple doses of Ativan he remained profoundly agitated.  Around 2 AM while out of bed he fell from standing and reportedly struck his head on the sink in his room.  After a total of 10 mg of Ativan in an 8-hour period PCCM was consulted for consideration of Precedex infusion and ICU transfer.  Past Medical History  COPD ETOH  abuse Tobacco Abuse   HTN Homelessness - living most recently at a shelter  Significant Hospital Events   8/30 Admit 8/31 worsening agitation transferred to ICU 9/2  Extubated  Consults:    Procedures:  ETT 8/31 >> 9/2  Significant Diagnostic Tests:  CT head 8/31 >> negative, atrophy and chronic small vessel disease  Micro Data:  COVID 8/29 >> negative   Antimicrobials:  Azithromycin 8/30 >>9/3  Interim history/subjective:  Extubated yesterday, satting well on 3L [place] today  Agitated overnight likely due to ativan discontinuation, started on IV precedex 0.5 mcg/kg/hr  BP fluctuating, normotensive while asleep, SBP 160s-170s this AM (before BP meds given)  Afebrile 98 / WBC 8.6  I/O: UOP 1L, Net -0.74L in last 24 hours (-3.6L since admission)  Objective   Blood pressure (!) 172/111, pulse 85, temperature 97.7 F (36.5 C), temperature source Axillary, resp. rate (!) 24, weight 118.4 kg, SpO2 (!) 83 %.    FiO2 (%):  [40 %-44 %] 44 %   Intake/Output Summary (Last 24 hours) at 05/26/2020 1035 Last data filed at 05/26/2020 1000 Gross per 24 hour  Intake 195.65 ml  Output 1315 ml  Net -1119.35 ml   Filed Weights   05/24/20 0326 05/25/20 0500  Weight: 116.5 kg 118.4 kg    Examination: General: Obese male lying in bed on 3L [place], NAD.   HEENT: mucous membranes pink/moist, anicteric. Voice still somewhat hoarse from recent intubation. Neuro: AAOx3, able to follow commands. Mental status improved from yesterday. CV:  Normal rate and regular rhythm, s1s2 heard, no m/r/g. PULM:  Course lung sounds bilaterally. No overt wheezing, crackles, or rales heard. Satting well (>90%) on 3L [place]. GI: Abdomen slightly firm, non-tender, non-distended. +BS Extremities: warm and dry, trace edema bilaterally.   Skin: no rashes or lesions.  Resolved Hospital Problem list     Assessment & Plan:   Acute on chronic hypoxemic respiratory failure Acute exacerbation of COPD Oxygen 3 L as an  outpatient since July. -extubated on 9/2 -satting well on 3L [place] today, which is at his baseline O2 requirement -continue brovana BID and atrovent q6h -continue duonebs prn -DC'd pulmicort as patient already receiving solumedrol IV -Day 5 of 5 of IV solumedrol -Day 5 of 5 of azithromycin -CXR unchanged from prior, continue intermittent CXR  Encephalopathy, likely multifactorial Hypertensive crisis  Acute delirium ETOH withdrawal  -improved mentation, AAOx3 on exam today -DC'd precedex this AM -Started CIWA with ativan for possible alcohol withdrawal, but patient has not required any ativan thus far -BP control (see below) -frequent neuro exams -passed bedside swallow, started on full liquid diet  Hypertensive Crisis Second admission for this in the past month. Systolic blood pressure greater than 200 on admission. BP fluctuating, normotensive (at goal) when asleep but hypertensive when awake.  -increased norvasc to 10mg  daily  -on home hydralazine 100mg , lopressor 25mg , and isordil 30mg  -IV lopressor pushes prn for SBP >170 -holding home losartan for now due to AKI -tele montioring  Mild AKI No clear baseline Cr, however likely around 0.94 - 1. -Cr 1.38 --> 1.44 -holding losartan -ordered urine electrolytes to assess etiology of AKI -will hold fluids for now as patient started on liquid diet -remove foley today  G1DD with preserved ejection fraction Seen on recent ECHO (7/23), EF 65-70%. -BP control as above  -follow I/O's, daily weights -will hold off on lasix at this time  Alcohol abuse Tobacco abuse -continue folate, thiamine, MVI  -substance abuse counseling when off vent   Best practice:  Diet: full liquid Pain/Anxiety/Delirium protocol (if indicated): ativan prn per CIWA VAP protocol (if indicated): NA DVT prophylaxis: Lovenox GI prophylaxis: PPI Glucose control: NA Mobility: Bedrest Code Status: Full code Family Communication: Brother (Tim), updated via  phone 8/31.   Disposition: ICU  Labs   CBC: Recent Labs  Lab 05/21/20 1704 05/21/20 1704 05/23/20 0442 05/23/20 0535 05/24/20 0443 05/25/20 0207 05/26/20 0209  WBC 7.9  --  12.5*  --  12.9* 10.6* 8.6  HGB 13.3   < > 12.9* 12.9* 14.7 13.8 12.3*  HCT 42.1   < > 40.0 38.0* 46.0 43.1 38.7*  MCV 96.3  --  97.1  --  95.8 98.0 98.7  PLT 356  --  341  --  292 311 271   < > = values in this interval not displayed.    Basic Metabolic Panel: Recent Labs  Lab 05/21/20 1704 05/21/20 1704 05/23/20 0442 05/23/20 0535 05/23/20 1648 05/24/20 0443 05/24/20 1836 05/25/20 0207 05/26/20 0209  NA 134*   < > 136 135  --  139  --  139 139  K 4.2   < > 4.6 4.7  --  4.1  --  4.1 4.5  CL 99  --  99  --   --  99  --  101 104  CO2 25  --  27  --   --  27  --  26 27  GLUCOSE 104*  --  133*  --   --  91  --  102* 90  BUN 8  --  15  --   --  18  --  29* 28*  CREATININE 1.16  --  1.22  --   --  1.11  --  1.38* 1.44*  CALCIUM 8.7*  --  8.9  --   --  9.3  --  9.1 8.6*  MG  --   --  2.4  --  2.5* 2.6* 2.4  --   --   PHOS  --   --  5.3*  --  4.2 4.9* 5.4*  --   --    < > = values in this interval not displayed.   GFR: Estimated Creatinine Clearance: 70 mL/min (A) (by C-G formula based on SCr of 1.44 mg/dL (H)). Recent Labs  Lab 05/23/20 0442 05/24/20 0443 05/25/20 0207 05/26/20 0209  WBC 12.5* 12.9* 10.6* 8.6    Liver Function Tests: Recent Labs  Lab 05/24/20 0443  AST 24  ALT 18  ALKPHOS 56  BILITOT 0.8  PROT 6.7  ALBUMIN 3.1*   No results for input(s): LIPASE, AMYLASE in the last 168 hours. No results for input(s): AMMONIA in the last 168 hours.  ABG    Component Value Date/Time   PHART 7.368 05/23/2020 0535   PCO2ART 55.6 (H) 05/23/2020 0535   PO2ART 213 (H) 05/23/2020 0535   HCO3 32.4 (H) 05/23/2020 0535   TCO2 34 (H) 05/23/2020 0535   O2SAT 100.0 05/23/2020 0535     Coagulation Profile: No results for input(s): INR, PROTIME in the last 168 hours.  Cardiac  Enzymes: No results for input(s): CKTOTAL, CKMB, CKMBINDEX, TROPONINI in the last 168 hours.  HbA1C: No results found for: HGBA1C  CBG: Recent Labs  Lab 05/25/20 1511 05/25/20 1923 05/25/20 2309 05/26/20 0315 05/26/20 0729  GLUCAP 95 110* 92 78 73     Critical care time: 36 minutes     Merrilyn Puma, MD 05/26/2020, 10:35 AM Pager: [phone]

## 2020-05-26 NOTE — Progress Notes (Signed)
Nutrition Follow-up  DOCUMENTATION CODES:   Obesity unspecified  INTERVENTION:   - Ensure Enlive po BID, each supplement provides 350 kcal and 20 grams of protein  - Continue MVI with minerals daily  NUTRITION DIAGNOSIS:   Inadequate oral intake related to inability to eat as evidenced by NPO status.  Progressing, pt now on full liquid diet  GOAL:   Patient will meet greater than or equal to 90% of their needs  Progressing  MONITOR:   PO intake, Supplement acceptance, Diet advancement, Labs, Weight trends, I & O's  REASON FOR ASSESSMENT:   Ventilator, Consult Enteral/tube feeding initiation and management  ASSESSMENT:   64 yo male admitted with COPD exacerbation. PMH includes ETOH abuse, smoker, HTN, homelessness.  8/31 - intubated 9/02 - extubated 9/03 - diet advanced to full liquids  Discussed pt with RN and during ICU rounds. Pt passed bedside swallow evaluation and is on a full liquid diet.  Spoke with pt at bedside. Pt drinking coffee at time of visit. Reports he has not yet gotten a meal tray but thinks he will be able to eat and drink without issue. Pt requesting RD look in his belongings and give him his red cell phone and charging cord.  Pt with moderate pitting edema to BUE and BLE. Difficult to discern dry weight at this time.  Medications reviewed and include: folic acid, solu-medrol, MVI with minerals, thiamine  Labs reviewed. CBG's: 73-110 x 24 hours  UOP: 1140 ml x 24 hours I/O's: -4.0 L since admit  Diet Order:   Diet Order            Diet full liquid Room service appropriate? Yes; Fluid consistency: Thin  Diet effective now                 EDUCATION NEEDS:   No education needs have been identified at this time  Skin:  Skin Assessment: Reviewed RN Assessment  Last BM:  05/24/20 medium type 7  Height:   Ht Readings from Last 1 Encounters:  05/10/20 6\' 2"  (1.88 m)    Weight:   Wt Readings from Last 1 Encounters:  05/25/20  118.4 kg    Ideal Body Weight:  86.4 kg  BMI:  Body mass index is 33.51 kg/m.  Estimated Nutritional Needs:   Kcal:  2200-2400  Protein:  110-130 grams  Fluid:  >/= 2.0 L    07/25/20, MS, RD, LDN Inpatient Clinical Dietitian Please see AMiON for contact information.

## 2020-05-27 LAB — CBC
HCT: 40.5 % (ref 39.0–52.0)
Hemoglobin: 12.7 g/dL — ABNORMAL LOW (ref 13.0–17.0)
MCH: 30.6 pg (ref 26.0–34.0)
MCHC: 31.4 g/dL (ref 30.0–36.0)
MCV: 97.6 fL (ref 80.0–100.0)
Platelets: 258 10*3/uL (ref 150–400)
RBC: 4.15 MIL/uL — ABNORMAL LOW (ref 4.22–5.81)
RDW: 15.8 % — ABNORMAL HIGH (ref 11.5–15.5)
WBC: 8.6 10*3/uL (ref 4.0–10.5)
nRBC: 0 % (ref 0.0–0.2)

## 2020-05-27 LAB — GLUCOSE, CAPILLARY
Glucose-Capillary: 97 mg/dL (ref 70–99)
Glucose-Capillary: 101 mg/dL — ABNORMAL HIGH (ref 70–99)
Glucose-Capillary: 125 mg/dL — ABNORMAL HIGH (ref 70–99)
Glucose-Capillary: 123 mg/dL — ABNORMAL HIGH (ref 70–99)

## 2020-05-27 LAB — BASIC METABOLIC PANEL
Anion gap: 10 (ref 5–15)
BUN: 29 mg/dL — ABNORMAL HIGH (ref 8–23)
CO2: 24 mmol/L (ref 22–32)
Calcium: 8.9 mg/dL (ref 8.9–10.3)
Chloride: 103 mmol/L (ref 98–111)
Creatinine, Ser: 1.41 mg/dL — ABNORMAL HIGH (ref 0.61–1.24)
GFR calc Af Amer: >60 mL/min (ref >60)
GFR calc non Af Amer: 52 mL/min — ABNORMAL LOW (ref >60)
Glucose, Bld: 110 mg/dL — ABNORMAL HIGH (ref 70–99)
Potassium: 3.6 mmol/L (ref 3.5–5.1)
Sodium: 137 mmol/L (ref 135–145)

## 2020-05-27 MED ORDER — ISOSORBIDE MONONITRATE ER 30 MG PO TB24: 30.0000 mg | ORAL_TABLET | Freq: Every day | ORAL | Status: DC

## 2020-05-27 MED ORDER — PREDNISONE 20 MG PO TABS
40.0000 mg | ORAL_TABLET | Freq: Every day | ORAL | Status: DC
  Administered 2020-05-28: 40 mg via ORAL
  Filled 2020-05-27: qty 2

## 2020-05-27 MED ORDER — HYDRALAZINE HCL 50 MG PO TABS
50.0000 mg | ORAL_TABLET | Freq: Three times a day (TID) | ORAL | Status: DC
  Administered 2020-05-27 – 2020-05-29 (×7): 50 mg via ORAL
  Filled 2020-05-27 (×7): qty 1

## 2020-05-27 MED ORDER — FUROSEMIDE 10 MG/ML IJ SOLN
40.0000 mg | Freq: Once | INTRAMUSCULAR | Status: AC
  Administered 2020-05-27: 40 mg via INTRAVENOUS
  Filled 2020-05-27: qty 4

## 2020-05-27 MED ORDER — DICLOFENAC SODIUM 1 % EX GEL
2.0000 g | Freq: Two times a day (BID) | CUTANEOUS | Status: DC | PRN
  Filled 2020-05-27: qty 100

## 2020-05-27 NOTE — Progress Notes (Signed)
Pt got his home machine and he will wear it by himself. RT will continue to monitor.

## 2020-05-27 NOTE — Progress Notes (Signed)
PROGRESS NOTE    William Pruitt  XVQ:[id number] DOB: 04/08/1956 DOA: 05/21/2020 PCP: Patient, No Pcp Per  Brief Narrative: 64/m homelessness, COPD, chronic respiratory failure on 2 L home O2, lives at [facility], alcohol abuse was admitted to [facility] on 8/30 with worsening shortness of breath which was felt to be secondary to COPD exacerbation, the following day/overnight developed acute delirium and agitation, from alcohol withdrawal, remained agitated after 10 mg of Ativan in an 8-hour period and subsequently admitted to the ICU and started on Precedex.  Intubated in the ICU, extubated on 9/2 -Also treated for hypertensive crisis   Assessment & Plan:   Acute on chronic hypoxemic respiratory failure Acute exacerbation of COPD Intubated and transferred to the ICU in the setting of encephalopathy and acute alcohol withdrawal, extubated on 9/2 -Clinically improving, completed 5 days of azithromycin -Continue duo nebs, Brovana -Completed 5 days of IV Solu-Medrol will change to prednisone taper for few more days -Ambulate, out of bed, PT OT  Acute metabolic encephalopathy, likely multifactorial Hypertensive crisis  ETOH withdrawal  -improved mentation, treated with IV Ativan initially and then with Precedex in the ICU -Out of window for acute withdrawal at this time -Continue thiamine -passed bedside swallow, started on full liquid diet  Hypertensive Crisis Second admission for this in the past month. Systolic blood pressure greater than 200 on admission. BP fluctuating, normotensive (at goal) when asleep but hypertensive when awake.  -increased norvasc to 10mg  daily  -Blood pressure better decrease hydralazine to 50 and change Isordil to Imdur -Lasix x1  Mild AKI No clear baseline Cr, however likely around 0.94 - 1. -Cr 1.38 --> 1.44 -holding losartan -Monitor  Acute on chronic diastolic CHF Seen on recent ECHO (7/23), EF 65-70%. -BP control as above    -Appears slightly fluid overloaded IV Lasix x1 today  Alcohol abuse Tobacco abuse -continue folate, thiamine, MVI  -substance abuse counseling   Homelessness -Lives at [facility]   DVT prophylaxis: Lovenox Code Status: Full code Family Communication: No family at bedside Disposition Plan:  Status is: Inpatient  Remains inpatient appropriate because:Inpatient level of care appropriate due to severity of illness   Dispo: The patient is from: Home              Anticipated d/c is to: Shelter              Anticipated d/c date is: 3 days              Patient currently is not medically stable to d/c. Consultants:   Pulmonary transfer   Procedures:   Antimicrobials:    Subjective: -Feels okay, oblivious about what happened and why he was admitted to the ICU  Objective: Vitals:   05/27/20 0700 05/27/20 0734 05/27/20 0800 05/27/20 0801  BP: (!) 152/84  (!) 168/91   Pulse: 91  92   Resp: (!) 23  (!) 25   Temp:  98.1 F (36.7 C)    TempSrc:  Oral    SpO2: 94%  95% 98%  Weight:        Intake/Output Summary (Last 24 hours) at 05/27/2020 1026 Last data filed at 05/27/2020 0800 Gross per 24 hour  Intake 870 ml  Output 215 ml  Net 655 ml   Filed Weights   05/24/20 0326 05/25/20 0500  Weight: 116.5 kg 118.4 kg    Examination:  General exam: Chronically ill appearing male appears much older than stated age, awake alert oriented  to self and place HEENT: Positive JVD CVS: S1-S2, regular rate rhythm Lungs: Poor air movement bilaterally, few scattered rhonchi Abdomen: Firm, distended, bowel sounds present Extremities: No edema Skin: No rashes on exposed skin Psychiatry: Poor insight and judgment   Data Reviewed:   CBC: Recent Labs  Lab 05/23/20 0442 05/23/20 0442 05/23/20 0535 05/24/20 0443 05/25/20 0207 05/26/20 0209 05/27/20 0122  WBC 12.5*  --   --  12.9* 10.6* 8.6 8.6  HGB 12.9*   < > 12.9* 14.7 13.8 12.3* 12.7*  HCT 40.0   < > 38.0* 46.0  43.1 38.7* 40.5  MCV 97.1  --   --  95.8 98.0 98.7 97.6  PLT 341  --   --  292 311 271 258   < > = values in this interval not displayed.   Basic Metabolic Panel: Recent Labs  Lab 05/23/20 0442 05/23/20 0442 05/23/20 0535 05/23/20 1648 05/24/20 0443 05/24/20 1836 05/25/20 0207 05/26/20 0209 05/27/20 0122  NA 136   < > 135  --  139  --  139 139 137  K 4.6   < > 4.7  --  4.1  --  4.1 4.5 3.6  CL 99  --   --   --  99  --  101 104 103  CO2 27  --   --   --  27  --  26 27 24   GLUCOSE 133*  --   --   --  91  --  102* 90 110*  BUN 15  --   --   --  18  --  29* 28* 29*  CREATININE 1.22  --   --   --  1.11  --  1.38* 1.44* 1.41*  CALCIUM 8.9  --   --   --  9.3  --  9.1 8.6* 8.9  MG 2.4  --   --  2.5* 2.6* 2.4  --   --   --   PHOS 5.3*  --   --  4.2 4.9* 5.4*  --   --   --    < > = values in this interval not displayed.   GFR: Estimated Creatinine Clearance: 71.4 mL/min (A) (by C-G formula based on SCr of 1.41 mg/dL (H)). Liver Function Tests: Recent Labs  Lab 05/24/20 0443  AST 24  ALT 18  ALKPHOS 56  BILITOT 0.8  PROT 6.7  ALBUMIN 3.1*   No results for input(s): LIPASE, AMYLASE in the last 168 hours. No results for input(s): AMMONIA in the last 168 hours. Coagulation Profile: No results for input(s): INR, PROTIME in the last 168 hours. Cardiac Enzymes: No results for input(s): CKTOTAL, CKMB, CKMBINDEX, TROPONINI in the last 168 hours. BNP (last 3 results) No results for input(s): PROBNP in the last 8760 hours. HbA1C: No results for input(s): HGBA1C in the last 72 hours. CBG: Recent Labs  Lab 05/26/20 1538 05/26/20 1950 05/26/20 2331 05/27/20 0428 05/27/20 0732  GLUCAP 125* 103* 114* 97 101*   Lipid Profile: No results for input(s): CHOL, HDL, LDLCALC, TRIG, CHOLHDL, LDLDIRECT in the last 72 hours. Thyroid Function Tests: No results for input(s): TSH, T4TOTAL, FREET4, T3FREE, THYROIDAB in the last 72 hours. Anemia Panel: No results for input(s): VITAMINB12,  FOLATE, FERRITIN, TIBC, IRON, RETICCTPCT in the last 72 hours. Urine analysis: No results found for: COLORURINE, APPEARANCEUR, LABSPEC, PHURINE, GLUCOSEU, HGBUR, BILIRUBINUR, KETONESUR, PROTEINUR, UROBILINOGEN, NITRITE, LEUKOCYTESUR Sepsis Labs: @LABRCNTIP (procalcitonin:4,lacticidven:4)  ) Recent Results (from the past 240 hour(s))  SARS  Coronavirus 2 by RT PCR (hospital order, performed in [facility] hospital lab) Nasopharyngeal Nasopharyngeal Swab     Status: None   Collection Time: 05/21/20  4:44 PM   Specimen: Nasopharyngeal Swab  Result Value Ref Range Status   SARS Coronavirus 2 NEGATIVE NEGATIVE Final    Comment: (NOTE) SARS-CoV-2 target nucleic acids are NOT DETECTED.  The SARS-CoV-2 RNA is generally detectable in upper and lower respiratory specimens during the acute phase of infection. The lowest concentration of SARS-CoV-2 viral copies this assay can detect is 250 copies / mL. A negative result does not preclude SARS-CoV-2 infection and should not be used as the sole basis for treatment or other patient management decisions.  A negative result may occur with improper specimen collection / handling, submission of specimen other than nasopharyngeal swab, presence of viral mutation(s) within the areas targeted by this assay, and inadequate number of viral copies (<250 copies / mL). A negative result must be combined with clinical observations, patient history, and epidemiological information.  Fact Sheet for Patients:   BoilerBrush.com.cy  Fact Sheet for Healthcare Providers: https://pope.com/  This test is not yet approved or  cleared by the [place] FDA and has been authorized for detection and/or diagnosis of SARS-CoV-2 by FDA under an Emergency Use Authorization (EUA).  This EUA will remain in effect (meaning this test can be used) for the duration of the COVID-19 declaration under Section 564(b)(1) of the Act, 21  U.S.C. section 360bbb-3(b)(1), unless the authorization is terminated or revoked sooner.  Performed at [facility], [address] [place], [place] [address]     Radiology Studies: DG CHEST PORT 1 VIEW  Result Date: 05/26/2020 CLINICAL DATA:  Pulmonary congestion and shortness of breath. EXAM: PORTABLE CHEST 1 VIEW COMPARISON:  05/25/2020 FINDINGS: The endotracheal tube and NG tube have been removed. Bilateral airspace opacities are again identified with mild bibasilar atelectasis. The cardiomediastinal silhouette is unchanged. No evidence of pneumothorax or large pleural effusion. IMPRESSION: Endotracheal tube and NG tube removal with continued bilateral airspace opacities and mild bibasilar atelectasis. Electronically Signed   By: Harmon Pier M.D.   On: 05/26/2020 09:05   Scheduled Meds: . amLODipine  10 mg Oral Daily  . arformoterol  15 mcg Nebulization BID  . Chlorhexidine Gluconate Cloth  6 each Topical Daily  . enoxaparin (LOVENOX) injection  40 mg Subcutaneous Q24H  . feeding supplement (ENSURE ENLIVE)  237 mL Oral BID BM  . folic acid  1 mg Oral Daily  . hydrALAZINE  100 mg Oral Q8H  . isosorbide dinitrate  30 mg Oral TID  . metoprolol tartrate  25 mg Oral BID  . multivitamin with minerals  1 tablet Oral Daily  . nicotine  7 mg Transdermal Daily  . sodium chloride flush  3 mL Intravenous Q12H  . thiamine  100 mg Oral Daily   Continuous Infusions:   LOS: 5 days    Time spent:  Zannie Cove, MD [facility]  05/27/2020, 10:26 AM

## 2020-05-28 LAB — CBC
HCT: 40.9 % (ref 39.0–52.0)
HCT: 43 % (ref 39.0–52.0)
Hemoglobin: 13.5 g/dL (ref 13.0–17.0)
Hemoglobin: 14.2 g/dL (ref 13.0–17.0)
MCH: 31.6 pg (ref 26.0–34.0)
MCH: 30.6 pg (ref 26.0–34.0)
MCHC: 33 g/dL (ref 30.0–36.0)
MCHC: 33 g/dL (ref 30.0–36.0)
MCV: 95.8 fL (ref 80.0–100.0)
MCV: 92.7 fL (ref 80.0–100.0)
Platelets: 292 10*3/uL (ref 150–400)
Platelets: 312 10*3/uL (ref 150–400)
RBC: 4.27 MIL/uL (ref 4.22–5.81)
RBC: 4.64 MIL/uL (ref 4.22–5.81)
RDW: 15.3 % (ref 11.5–15.5)
RDW: 14.6 % (ref 11.5–15.5)
WBC: 9.4 10*3/uL (ref 4.0–10.5)
WBC: 9.7 10*3/uL (ref 4.0–10.5)
nRBC: 0 % (ref 0.0–0.2)
nRBC: 0 % (ref 0.0–0.2)

## 2020-05-28 LAB — COMPREHENSIVE METABOLIC PANEL
ALT: 29 U/L (ref 0–44)
AST: 22 U/L (ref 15–41)
Albumin: 3.6 g/dL (ref 3.5–5.0)
Alkaline Phosphatase: 48 U/L (ref 38–126)
Anion gap: 12 (ref 5–15)
BUN: 21 mg/dL (ref 8–23)
CO2: 23 mmol/L (ref 22–32)
Calcium: 9.8 mg/dL (ref 8.9–10.3)
Chloride: 98 mmol/L (ref 98–111)
Creatinine, Ser: 1.33 mg/dL — ABNORMAL HIGH (ref 0.61–1.24)
GFR calc Af Amer: >60 mL/min (ref >60)
GFR calc non Af Amer: 56 mL/min — ABNORMAL LOW (ref >60)
Glucose, Bld: 109 mg/dL — ABNORMAL HIGH (ref 70–99)
Potassium: 4.1 mmol/L (ref 3.5–5.1)
Sodium: 133 mmol/L — ABNORMAL LOW (ref 135–145)
Total Bilirubin: 1 mg/dL (ref 0.3–1.2)
Total Protein: 7.7 g/dL (ref 6.5–8.1)

## 2020-05-28 LAB — BASIC METABOLIC PANEL
Anion gap: 8 (ref 5–15)
BUN: 22 mg/dL (ref 8–23)
CO2: 28 mmol/L (ref 22–32)
Calcium: 9.1 mg/dL (ref 8.9–10.3)
Chloride: 100 mmol/L (ref 98–111)
Creatinine, Ser: 1.32 mg/dL — ABNORMAL HIGH (ref 0.61–1.24)
GFR calc Af Amer: >60 mL/min (ref >60)
GFR calc non Af Amer: 56 mL/min — ABNORMAL LOW (ref >60)
Glucose, Bld: 112 mg/dL — ABNORMAL HIGH (ref 70–99)
Potassium: 3.5 mmol/L (ref 3.5–5.1)
Sodium: 136 mmol/L (ref 135–145)

## 2020-05-28 LAB — MAGNESIUM: Magnesium: 2.2 mg/dL (ref 1.7–2.4)

## 2020-05-28 LAB — ETHANOL: Alcohol, Ethyl (B): <10 mg/dL (ref <10)

## 2020-05-28 LAB — PHOSPHORUS: Phosphorus: 3.1 mg/dL (ref 2.5–4.6)

## 2020-05-28 MED ORDER — THIAMINE HCL 100 MG/ML IJ SOLN: 100.0000 mg | Freq: Every day | INTRAMUSCULAR | Status: DC

## 2020-05-28 MED ORDER — LORAZEPAM 1 MG PO TABS
1.0000 mg | ORAL_TABLET | ORAL | Status: DC | PRN
  Administered 2020-05-28: 1 mg via ORAL
  Filled 2020-05-28: qty 1

## 2020-05-28 MED ORDER — HALOPERIDOL LACTATE 5 MG/ML IJ SOLN
2.0000 mg | Freq: Once | INTRAMUSCULAR | Status: AC
  Administered 2020-05-28: 2 mg via INTRAVENOUS
  Filled 2020-05-28: qty 1

## 2020-05-28 MED ORDER — ADULT MULTIVITAMIN W/MINERALS CH: 1.0000 | ORAL_TABLET | Freq: Every day | ORAL | Status: DC

## 2020-05-28 MED ORDER — POTASSIUM CHLORIDE CRYS ER 20 MEQ PO TBCR
40.0000 meq | EXTENDED_RELEASE_TABLET | Freq: Once | ORAL | Status: AC
  Administered 2020-05-28: 40 meq via ORAL
  Filled 2020-05-28: qty 2

## 2020-05-28 MED ORDER — PREDNISONE 20 MG PO TABS
20.0000 mg | ORAL_TABLET | Freq: Every day | ORAL | Status: DC
  Administered 2020-05-29: 20 mg via ORAL
  Filled 2020-05-28: qty 1

## 2020-05-28 MED ORDER — FUROSEMIDE 10 MG/ML IJ SOLN
40.0000 mg | Freq: Once | INTRAMUSCULAR | Status: AC
  Administered 2020-05-28: 40 mg via INTRAVENOUS
  Filled 2020-05-28: qty 4

## 2020-05-28 MED ORDER — FOLIC ACID 1 MG PO TABS: 1.0000 mg | ORAL_TABLET | Freq: Every day | ORAL | Status: DC

## 2020-05-28 MED ORDER — ISOSORBIDE MONONITRATE ER 60 MG PO TB24
60.0000 mg | ORAL_TABLET | Freq: Every day | ORAL | Status: DC
  Administered 2020-05-28 – 2020-05-29 (×2): 60 mg via ORAL
  Filled 2020-05-28 (×2): qty 1

## 2020-05-28 MED ORDER — HYDRALAZINE HCL 20 MG/ML IJ SOLN
10.0000 mg | Freq: Once | INTRAMUSCULAR | Status: AC
  Administered 2020-05-28: 10 mg via INTRAVENOUS
  Filled 2020-05-28: qty 1

## 2020-05-28 MED ORDER — LORAZEPAM 2 MG/ML IJ SOLN
1.0000 mg | INTRAMUSCULAR | Status: DC | PRN
  Filled 2020-05-28 (×2): qty 1

## 2020-05-28 MED ORDER — THIAMINE HCL 100 MG PO TABS
100.0000 mg | ORAL_TABLET | Freq: Every day | ORAL | Status: DC
  Administered 2020-05-29: 100 mg via ORAL
  Filled 2020-05-28: qty 1

## 2020-05-28 NOTE — Progress Notes (Signed)
PROGRESS NOTE    William Pruitt  UXN:[id number] DOB: March 01, 1956 DOA: 05/21/2020 PCP: Patient, No Pcp Per  Brief Narrative: 64/m homelessness, COPD, chronic respiratory failure on 2 L home O2, lives at [facility], alcohol abuse was admitted to [facility] on 8/30 with worsening shortness of breath which was felt to be secondary to COPD exacerbation, the following day/overnight developed acute delirium and agitation, from alcohol withdrawal, remained agitated after 10 mg of Ativan in an 8-hour period and subsequently admitted to the ICU, intubated and started on Precedex, extubated on 9/2 -Also treated for hypertensive crisis -Transferred from [facility] to [facility] service 9/4  Assessment & Plan:   Acute on chronic hypoxemic respiratory failure Acute exacerbation of COPD Intubated and transferred to the ICU in the setting of encephalopathy and acute alcohol withdrawal, extubated on 9/2 -Clinically improving, completed 5 days of azithromycin -Continue duo nebs, Brovana -Treated with IV Solu-Medrol in ICU, no wheezing at this time will slowly taper off prednisone -Ambulate, PT OT -Discharge planning-lives at [facility]  Acute metabolic encephalopathy, likely multifactorial Hypertensive crisis  ETOH withdrawal  -improved mentation, treated with IV Ativan initially and then with Precedex in the ICU -Out of window for acute withdrawal at this time -Continue thiamine -passed bedside swallow, started on full liquid diet  Hypertensive Crisis Second admission for this in the past month. Systolic blood pressure greater than 200 on admission.  -I think volume overload was also contributing to a certain extent -IV Lasix x1 again today -Continue hydralazine, amlodipine, increase Imdur, continue Toprol, continue meds  Mild AKI No clear baseline Cr, however likely around 0.94 - 1. -Cr 1.38 --> 1.44 -holding losartan -Monitor  Acute on chronic diastolic CHF Seen on recent  ECHO (7/23), EF 65-70%. -BP control as above  -Appears mildly fluid overloaded, Lasix again x1  Alcohol abuse Tobacco abuse -continue folate, thiamine, MVI  -substance abuse counseling   Homelessness -Lives at [facility] [facility]   DVT prophylaxis: Lovenox Code Status: Full code Family Communication: No family at bedside Disposition Plan:  Status is: Inpatient  Remains inpatient appropriate because:Inpatient level of care appropriate due to severity of illness   Dispo: The patient is from: Home              Anticipated d/c is to: Shelter              Anticipated d/c date is: 1-2days              Patient currently is not medically stable to d/c. Consultants:   Pulmonary transfer   Procedures:   Antimicrobials:    Subjective: -Breathing starting to improve, not back to baseline yet,  Objective: Vitals:   05/27/20 2123 05/28/20 0451 05/28/20 0849 05/28/20 0915  BP:  (!) 188/105  (!) 161/95  Pulse:  88  90  Resp:  18  18  Temp:  98.2 F (36.8 C)  99.1 F (37.3 C)  TempSrc:    Oral  SpO2: 93% 93% 96% 93%  Weight:  115 kg      Intake/Output Summary (Last 24 hours) at 05/28/2020 1148 Last data filed at 05/28/2020 2542 Gross per 24 hour  Intake 960 ml  Output 3005 ml  Net -2045 ml   Filed Weights   05/24/20 0326 05/25/20 0500 05/28/20 0451  Weight: 116.5 kg 118.4 kg 115 kg    Examination:  General exam: Chronically ill-appearing male appears much older than stated age, awake alert oriented to self and  place HEENT: Positive JVD CVS: S1-S2, regular rate rhythm Lungs: Improving air movement, no wheezes noted, few scattered rhonchi at the bases Abdomen: Firm, distended, bowel sounds present Extremities: No edema  Skin: No rashes on exposed skin Psychiatry: Poor insight and judgment   Data Reviewed:   CBC: Recent Labs  Lab 05/24/20 0443 05/25/20 0207 05/26/20 0209 05/27/20 0122 05/28/20 0230  WBC 12.9* 10.6* 8.6 8.6 9.4  HGB 14.7 13.8 12.3*  12.7* 13.5  HCT 46.0 43.1 38.7* 40.5 40.9  MCV 95.8 98.0 98.7 97.6 95.8  PLT 292 311 271 258 292   Basic Metabolic Panel: Recent Labs  Lab 05/23/20 0442 05/23/20 0442 05/23/20 0535 05/23/20 1648 05/24/20 0443 05/24/20 1836 05/25/20 0207 05/26/20 0209 05/27/20 0122 05/28/20 0230  NA 136   < >   < >  --  139  --  139 139 137 136  K 4.6   < >   < >  --  4.1  --  4.1 4.5 3.6 3.5  CL 99   < >  --   --  99  --  101 104 103 100  CO2 27   < >  --   --  27  --  26 27 24 28   GLUCOSE 133*   < >  --   --  91  --  102* 90 110* 112*  BUN 15   < >  --   --  18  --  29* 28* 29* 22  CREATININE 1.22   < >  --   --  1.11  --  1.38* 1.44* 1.41* 1.32*  CALCIUM 8.9   < >  --   --  9.3  --  9.1 8.6* 8.9 9.1  MG 2.4  --   --  2.5* 2.6* 2.4  --   --   --   --   PHOS 5.3*  --   --  4.2 4.9* 5.4*  --   --   --   --    < > = values in this interval not displayed.   GFR: Estimated Creatinine Clearance: 75.2 mL/min (A) (by C-G formula based on SCr of 1.32 mg/dL (H)). Liver Function Tests: Recent Labs  Lab 05/24/20 0443  AST 24  ALT 18  ALKPHOS 56  BILITOT 0.8  PROT 6.7  ALBUMIN 3.1*   No results for input(s): LIPASE, AMYLASE in the last 168 hours. No results for input(s): AMMONIA in the last 168 hours. Coagulation Profile: No results for input(s): INR, PROTIME in the last 168 hours. Cardiac Enzymes: No results for input(s): CKTOTAL, CKMB, CKMBINDEX, TROPONINI in the last 168 hours. BNP (last 3 results) No results for input(s): PROBNP in the last 8760 hours. HbA1C: No results for input(s): HGBA1C in the last 72 hours. CBG: Recent Labs  Lab 05/26/20 2331 05/27/20 0428 05/27/20 0732 05/27/20 1135 05/27/20 1641  GLUCAP 114* 97 101* 125* 123*   Lipid Profile: No results for input(s): CHOL, HDL, LDLCALC, TRIG, CHOLHDL, LDLDIRECT in the last 72 hours. Thyroid Function Tests: No results for input(s): TSH, T4TOTAL, FREET4, T3FREE, THYROIDAB in the last 72 hours. Anemia Panel: No results  for input(s): VITAMINB12, FOLATE, FERRITIN, TIBC, IRON, RETICCTPCT in the last 72 hours. Urine analysis: No results found for: COLORURINE, APPEARANCEUR, LABSPEC, PHURINE, GLUCOSEU, HGBUR, BILIRUBINUR, KETONESUR, PROTEINUR, UROBILINOGEN, NITRITE, LEUKOCYTESUR Sepsis Labs: @LABRCNTIP (procalcitonin:4,lacticidven:4)  ) Recent Results (from the past 240 hour(s))  SARS Coronavirus 2 by RT PCR (hospital order, performed in [facility] hospital  lab) Nasopharyngeal Nasopharyngeal Swab     Status: None   Collection Time: 05/21/20  4:44 PM   Specimen: Nasopharyngeal Swab  Result Value Ref Range Status   SARS Coronavirus 2 NEGATIVE NEGATIVE Final    Comment: (NOTE) SARS-CoV-2 target nucleic acids are NOT DETECTED.  The SARS-CoV-2 RNA is generally detectable in upper and lower respiratory specimens during the acute phase of infection. The lowest concentration of SARS-CoV-2 viral copies this assay can detect is 250 copies / mL. A negative result does not preclude SARS-CoV-2 infection and should not be used as the sole basis for treatment or other patient management decisions.  A negative result may occur with improper specimen collection / handling, submission of specimen other than nasopharyngeal swab, presence of viral mutation(s) within the areas targeted by this assay, and inadequate number of viral copies (<250 copies / mL). A negative result must be combined with clinical observations, patient history, and epidemiological information.  Fact Sheet for Patients:   BoilerBrush.com.cy  Fact Sheet for Healthcare Providers: https://pope.com/  This test is not yet approved or  cleared by the [place] FDA and has been authorized for detection and/or diagnosis of SARS-CoV-2 by FDA under an Emergency Use Authorization (EUA).  This EUA will remain in effect (meaning this test can be used) for the duration of the COVID-19 declaration under Section  564(b)(1) of the Act, 21 U.S.C. section 360bbb-3(b)(1), unless the authorization is terminated or revoked sooner.  Performed at [facility], [address], [place], [place] [address]     Radiology Studies: No results found. Scheduled Meds: . amLODipine  10 mg Oral Daily  . arformoterol  15 mcg Nebulization BID  . Chlorhexidine Gluconate Cloth  6 each Topical Daily  . enoxaparin (LOVENOX) injection  40 mg Subcutaneous Q24H  . feeding supplement (ENSURE ENLIVE)  237 mL Oral BID BM  . folic acid  1 mg Oral Daily  . hydrALAZINE  50 mg Oral Q8H  . isosorbide mononitrate  60 mg Oral Daily  . metoprolol tartrate  25 mg Oral BID  . multivitamin with minerals  1 tablet Oral Daily  . nicotine  7 mg Transdermal Daily  . predniSONE  40 mg Oral Q breakfast  . sodium chloride flush  3 mL Intravenous Q12H  . thiamine  100 mg Oral Daily   Continuous Infusions:   LOS: 6 days   Time spent:  Zannie Cove, MD [facility]  05/28/2020, 11:48 AM

## 2020-05-28 NOTE — TOC CAGE-AID Note (Signed)
Transition of Care [facility]) - CAGE-AID Screening   Patient Details  Name: William Pruitt MRN: [id number] Date of Birth: [birth year]  Transition of Care [facility]) CM/SW Contact:    Emeterio Reeve, [place] Phone Number: 05/28/2020, 2:19 PM   Clinical Narrative:  CSW met with pt at bedside. CSW introduced self and explained her role at the hospital.  PT reports that he drinks at least 3 beers a day and does not plan on stopping. Pt denied substance use. Pt declined resources stating hes not stopping.   Pt stated he had a bottle of liquor when he was admitted and wants it back. CSW informed pt that she was unsure if the bottle was discarded or will be returned at discharge. CSW stated that he would not receive the bottle while admitted.   CSW spoke to pt about snf placement. Pt stated his friend is visiting him today and he will talk to him about going to his home after discharge. Pt stated that he is still unsure about SNF. CSW inquired if she can start the snf process as a back up and pt declined.   CAGE-AID Screening:    Have You Ever Felt You Ought to Cut Down on Your Drinking or Drug Use?: No Have People Annoyed You By Critizing Your Drinking Or Drug Use?: No Have You Felt Bad Or Guilty About Your Drinking Or Drug Use?: No Have You Ever Had a Drink or Used Drugs First Thing In The Morning to Steady Your Nerves or to Get Rid of a Hangover?: No CAGE-AID Score: 0  Substance Abuse Education Offered: Yes      Blima Ledger, [place] Social Worker [phone]

## 2020-05-28 NOTE — Progress Notes (Signed)
Found an empty vodka bottle in the pts bed. Pt is sitting in urine and feces, he was cleaned. Pt is alert but has delayed responses. Asked pt if he has drank from the bottle and he denies drinking alcohol. MD was notified awaiting orders. Will continue to monitor patient

## 2020-05-29 MED ORDER — PREDNISONE 20 MG PO TABS: 20.0000 mg | ORAL_TABLET | Freq: Every day | ORAL | 0 refills | Status: AC

## 2020-05-29 MED ORDER — ISOSORBIDE MONONITRATE ER 30 MG PO TB24
30.0000 mg | ORAL_TABLET | Freq: Every day | ORAL | 0 refills | Status: DC
Start: 2020-05-29 — End: 2020-06-21

## 2020-05-29 NOTE — Care Management Important Message (Signed)
Important Message  Patient Details  Name: William Pruitt MRN: [id number] Date of Birth: [birth year]   Medicare Important Message Given:  Yes     Dorena Bodo 05/29/2020, 11:31 AM

## 2020-05-29 NOTE — Progress Notes (Signed)
DISCHARGE NOTE HOME LEVITICUS HARTON to be discharged Home per MD order. Discussed prescriptions and follow up appointments with the patient. Prescriptions given to patient; medication list explained in detail. Patient verbalized understanding.  Skin clean, dry and intact without evidence of skin break down, no evidence of skin tears noted. IV catheter discontinued intact. Site without signs and symptoms of complications. Dressing and pressure applied. Pt denies pain at the site currently. No complaints noted.  Patient free of lines, drains, and wounds.   An After Visit Summary (AVS) was printed and given to the patient. Patient escorted via wheelchair, and discharged home via private auto.  Katrina Stack, RN

## 2020-05-29 NOTE — TOC Transition Note (Signed)
Transition of Care [facility]) - CM/SW Discharge Note   Patient Details  Name: William Pruitt MRN: [id number] Date of Birth: 1956-06-01  [facility]) CM/SW Contact:  Bess Kinds, RN Phone Number: [phone] 05/29/2020, 2:26 PM   Clinical Narrative:     Spoke with patient at the bedside. He has been staying at [facility] who is helping him to find permanent housing. Portable concentrator from [employer] at the bedside. He thinks one of his brothers will be picking him up from hospital, or he will take the bus. Bus pass provided. No further TOC needs identified.  Final next level of care: Homeless Shelter Barriers to Discharge: No Barriers Identified   Patient Goals and CMS Choice Patient states their goals for this hospitalization and ongoing recovery are:: back to [employer].gov Compare Post Acute Care list provided to:: Patient Choice offered to / list presented to : NA  Discharge Placement                       Discharge Plan and Services     Post Acute Care Choice: NA          DME Arranged: N/A DME Agency: NA       HH Arranged: NA HH Agency: NA        Social Determinants of Health (SDOH) Interventions     Readmission Risk Interventions No flowsheet data found.

## 2020-05-29 NOTE — Progress Notes (Signed)
Physical Therapy Treatment and discharge Patient Details Name: William Pruitt MRN: [id number] DOB: 1955-10-09 Today's Date: 05/29/2020    History of Present Illness Pt is a 64 year old man admitted 05/22/20 with acute on chronic hypoxic respiratory failure. He became increasing combative due to alcohol withdrawal, fell and struck his head (CT negative for acute changes), and was intubated for airway protection on 05/23/20. Pt on CIWA precautions. PMH: COPD on 3L 02 at baseline, alcohol abuse, smoker and non compliance with 02 and inhaled therapies due to homelessness..    PT Comments    Continued work on functional mobility and activity tolerance;  Significant improvement in functional mobility and ambulation since coming off of the vent; walking the hallways independently; OK for dc home from PT standpoint; All acute PT goals met; will dc PT  Follow Up Recommendations  No PT follow up     Equipment Recommendations  None recommended by PT    Recommendations for Other Services       Precautions / Restrictions Precautions Precautions: None Restrictions Weight Bearing Restrictions: No    Mobility  Bed Mobility Overal bed mobility: Independent                Transfers Overall transfer level: Independent Equipment used: None                Ambulation/Gait Ambulation/Gait assistance: Supervision;Modified independent (Device/Increase time) Gait Distance (Feet): 250 Feet Assistive device: None Gait Pattern/deviations: Step-through pattern Gait velocity: approaching normal   General Gait Details: noted occasional toe catch/scuffing, but it did not throw off his balance   Stairs             Wheelchair Mobility    Modified Rankin (Stroke Patients Only)       Balance Overall balance assessment: Mild deficits observed, not formally tested                                          Cognition Arousal/Alertness: Awake/alert Behavior During  Therapy: WFL for tasks assessed/performed Overall Cognitive Status: Within Functional Limits for tasks assessed (for simple tasks)                                        Exercises      General Comments General comments (skin integrity, edema, etc.): walked on 2 L supplemental O2; O2 sats 97% end of amb      Pertinent Vitals/Pain Pain Assessment: No/denies pain    Home Living                      Prior Function            PT Goals (current goals can now be found in the care plan section) Acute Rehab PT Goals Patient Stated Goal: Home today, and he promised to cut back on his drinking Progress towards PT goals: Goals met/education completed, patient discharged from PT    Frequency    Min 2X/week      PT Plan Discharge plan needs to be updated (good recovery, SNF not needed)    Co-evaluation              AM-PAC PT "6 Clicks" Mobility   Outcome Measure  Help needed turning from your back to your side while  in a flat bed without using bedrails?: None Help needed moving from lying on your back to sitting on the side of a flat bed without using bedrails?: None Help needed moving to and from a bed to a chair (including a wheelchair)?: None Help needed standing up from a chair using your arms (e.g., wheelchair or bedside chair)?: None Help needed to walk in hospital room?: None Help needed climbing 3-5 steps with a railing? : None 6 Click Score: 24    End of Session Equipment Utilized During Treatment: Oxygen Activity Tolerance: Patient tolerated treatment well Patient left: in bed;with call bell/phone within reach;with SCD's reapplied Nurse Communication: Mobility status PT Visit Diagnosis: Other abnormalities of gait and mobility (R26.89);Difficulty in walking, not elsewhere classified (R26.2)     Time: 7395-8441 PT Time Calculation (min) (ACUTE ONLY): 9 min  Charges:  $Gait Training: 8-22 mins                     Roney Marion,  [place]  [facility] Pager [phone] Office [place] 05/29/2020, 3:17 PM

## 2020-05-29 NOTE — Plan of Care (Signed)
  Problem: Coping: Goal: Level of anxiety will decrease Outcome: Progressing   Problem: Education: Goal: Knowledge of the prescribed therapeutic regimen will improve Outcome: Progressing   

## 2020-05-29 NOTE — Care Management Important Message (Signed)
Important Message  Patient Details  Name: William Pruitt MRN: [id number] Date of Birth: 03/24/1956   Medicare Important Message Given:  Yes     Dorena Bodo 05/29/2020, 11:30 AM

## 2020-05-29 NOTE — Care Management Important Message (Signed)
Important Message  Patient Details  Name: William Pruitt MRN: [id number] Date of Birth: 1956-06-14   Medicare Important Message Given:  Yes - Important Message mailed due to current National Emergency  Verbal consent obtained due to current National Emergency  Relationship to patient: Self Contact Name: Jade Burright Call Date: 05/29/20  Time: 1122 Phone: [phone] Outcome: No Answer/Busy Important Message mailed to: Patient address on file    Orson Aloe 05/29/2020, 11:22 AM

## 2020-05-29 NOTE — Progress Notes (Signed)
   05/28/20 1950  Assess: if the MEWS score is Yellow or Red  Were vital signs taken at a resting state? No  Focused Assessment Change from prior assessment (see assessment flowsheet)  Early Detection of Sepsis Score *See Row Information* Low  MEWS guidelines implemented *See Row Information* No, other (Comment) (unable to take @ rest - pulling tele off, threatening leave)  Take Vital Signs  Increase Vital Sign Frequency   (unable to do Q2 hours - pt uncooperative)  Escalate  MEWS: Escalate Yellow: discuss with charge nurse/RN and consider discussing with provider and RRT  Notify: Charge Nurse/RN  Name of Charge Nurse/RN Notified Christie CN  Date Charge Nurse/RN Notified 05/29/20  Time Charge Nurse/RN Notified 1920  Notify: Provider  Provider Name/Title Zierle-Ghosh  Date Provider Notified 05/28/20  Time Provider Notified 1950  Notification Type Page  Notification Reason Change in status  Response See new orders  Date of Provider Response 05/28/20   At shift exchange, I received in report that an empty bottle of [employer] Vodka was found in the patient's room.  He had a visitor earlier in the day.  The bottle was empty.  Upon entry into the room, the patient was dressed in street clothes that smelled of urine and feces.  There was stool on the chair beside the bed and on the bed.  He was agitated and stated he wanted to leave.  His eyes were watery and his gait was unstable.  We asked if he had drank the Vodka.  He stated no.  We explained it was not safe for him to leave with his blood pressure being 211/151.    The patient was now a yellow mews.  The VS were not able to be done at a resting state due to the patient's agitation.  [facility] made aware of situation with agitation, possible alcohol consumption, elevated blood pressure.  Security called because patient becoming more aggressive in his desire to leave.  Order for Haldol and Hydralazine received.  We were able to  calm patient down enough to give medications.  He refused bed alarm for safety.  Security did search belongings to make sure no further substances with patient's permission.  We were unable to implement frequency of VS for Yellow Mews due to continuing irritability.  CIWA score was 10 and I was able to given 1 mg PO Ativan with HS meds.  At 2233, his BP was 131/75.  Patient slept for a little while.  At approximately 0200, he awoke.  He is refusing to put on clean gown and is wearing his clothes.  He is sitting in chair.  He agreed to wear telemetry but states he wants an ATM so he can get his fix.  He states he is going to leave when he gets a chance.  He refused AM labs.  He did agree to VS and BP at 0433 was 160/101.  His cooperation waxes and wanes.  Will continue to monitor patient.  Earleen Reaper RN

## 2020-05-29 NOTE — Plan of Care (Signed)
  Problem: Education: Goal: Knowledge of General Education information will improve Description: Including pain rating scale, medication(s)/side effects and non-pharmacologic comfort measures Outcome: Adequate for Discharge   Problem: Health Behavior/Discharge Planning: Goal: Ability to manage health-related needs will improve 05/29/2020 1330 by Katrina Stack, RN Outcome: Adequate for Discharge 05/29/2020 0839 by Katrina Stack, RN Outcome: Progressing   Problem: Clinical Measurements: Goal: Ability to maintain clinical measurements within normal limits will improve Outcome: Adequate for Discharge Goal: Will remain free from infection Outcome: Adequate for Discharge Goal: Diagnostic test results will improve Outcome: Adequate for Discharge Goal: Respiratory complications will improve Outcome: Adequate for Discharge Goal: Cardiovascular complication will be avoided Outcome: Adequate for Discharge

## 2020-06-07 NOTE — Discharge Summary (Signed)
Physician Discharge Summary  William Pruitt ZOX:[id number] DOB: [birth year] DOA: 05/21/2020  PCP: Patient, No Pcp Per  Admit date: 05/21/2020 Discharge date: 05/29/2020  Time spent: 35 minutes  Recommendations for Outpatient Follow-up:  1. PCP in 1 week, [facility] 2. Please assess volume status at follow-up and consider low-dose diuretic if needed   Discharge Diagnoses:  Acute on chronic hypoxic respiratory failure COPD exacerbation (HCC) Homelessness ETOH abuse Tobacco abuse Acute metabolic encephalopathy Hypertensive crisis Alcohol withdrawal Acute kidney injury Acute on chronic diastolic CHF   Hypertensive urgency   Chronic respiratory failure with hypoxia (HCC)   Elevated troponin   Diastolic dysfunction   Acute respiratory failure with hypoxia [facility](HCC)   Discharge Condition: Stable  Diet recommendation: Low-sodium, heart healthy  Filed Weights   05/24/20 0326 05/25/20 0500 05/28/20 0451  Weight: 116.5 kg 118.4 kg 115 kg    History of present illness:  64/m homelessness, COPD, chronic respiratory failure on 2 L home O2, lives [facility], alcohol abuse was admitted to [facility] on [facility]/30 with worsening shortness of breath which was felt to be secondary to COPD exacerbation, the following day/overnight developed acute delirium and agitation, from alcohol withdrawal, remained agitated after 10 mg of Ativan in an 8-hour period and subsequently admitted to the ICU, intubated and started on Precedex, extubated on 9/2 -Also treated for hypertensive crisis   Hospital Course:   Acute on chronic hypoxemic respiratory failure Acute exacerbation of COPD Intubated and transferred to the ICU in the setting of encephalopathy and acute alcohol withdrawal, extubated on 9/2 -Clinically improving, completed 5 days of azithromycin -Continue duo nebs, Brovana -Treated with IV Solu-Medrol in ICU, no wheezing at this time, tapered off  prednisone  -Now improved and stable, ambulating in the halls with his oxygen, uses 2 to 3 L of oxygen at the shelter -Seen by social work/case management he will be discharged back [facility] today  Acute metabolic encephalopathy, likely multifactorial Hypertensive crisis  ETOH withdrawal  -improved mentation, treated with IV Ativan initially and then with Precedex in the ICU -Out of window for acute withdrawal at this time -Continue thiamine -passed bedside swallow, tolerating diet  Hypertensive Crisis Second admission for this in the past month. Systolic blood pressure greater than 200 on admission.  -Received as needed doses of Lasix as well -Continue hydralazine, amlodipine, Imdur, Toprol  Mild AKI No clear baseline Cr, however likely around 0.94 - 1. -Cr 1.38 --> 1.44 -Improved and stable now, losartan resumed at discharge at lower dose  Acute on chronic diastolic CHF Seen on recent ECHO (7/23), EF 65-70%. -BP control as above  -Diuresed with IV Lasix few days ago -Euvolemic now, has not required diuretics in a few days, please assess volume status at follow-up  Alcohol abuse Tobacco abuse -continue folate, thiamine, MVI  -substance abuse counseling provided  Homelessness -[facility]   Consultations:  PCCM transfer  Discharge Exam: Vitals:   05/29/20 0837 05/29/20 1010  BP:  (!) 146/85  Pulse:  88  Resp:  16  Temp:  98.9 F (37.2 C)  SpO2: 95% (!) 86%    General: Awake alert, oriented x2 Cardiovascular: S1-S2, regular rate rhythm Respiratory: Poor air movement bilaterally, no wheezes today  Discharge Instructions   Discharge Instructions    Diet - low sodium heart healthy   Complete by: As directed    Increase activity slowly   Complete by: As  directed      Allergies as of 05/29/2020   No Known Allergies     Medication List    STOP taking these medications   ibuprofen 200 MG tablet Commonly known as:  ADVIL   isosorbide dinitrate 30 MG tablet Commonly known as: ISORDIL   predniSONE 10 MG (21) Tbpk tablet Commonly known as: STERAPRED UNI-PAK 21 TAB     TAKE these medications   acetaminophen 325 MG tablet Commonly known as: TYLENOL Take 2 tablets (650 mg total) by mouth every 6 (six) hours as needed for mild pain or headache (fever >/= 101).   albuterol 108 (90 Base) MCG/ACT inhaler Commonly known as: VENTOLIN HFA Inhale 2 puffs into the lungs every 6 (six) hours as needed for wheezing or shortness of breath.   diclofenac Sodium 1 % Gel Commonly known as: VOLTAREN Apply 4 g topically 4 (four) times daily as needed (right knee pain).   hydrALAZINE 100 MG tablet Commonly known as: APRESOLINE Take 1 tablet (100 mg total) by mouth every 8 (eight) hours.   isosorbide mononitrate 30 MG 24 hr tablet Commonly known as: IMDUR Take 1 tablet (30 mg total) by mouth daily.   losartan 25 MG tablet Commonly known as: COZAAR Take 1 tablet (25 mg total) by mouth daily.   metoprolol tartrate 25 MG tablet Commonly known as: LOPRESSOR Take 0.5 tablets (12.5 mg total) by mouth 2 (two) times daily.   umeclidinium bromide 62.5 MCG/INH Aepb Commonly known as: INCRUSE ELLIPTA Inhale 1 puff into the lungs daily.     ASK your doctor about these medications   predniSONE 20 MG tablet Commonly known as: DELTASONE Take 1 tablet (20 mg total) by mouth daily with breakfast for 2 days. Ask about: Should I take this medication?      No Known Allergies    The results of significant diagnostics from this hospitalization (including imaging, microbiology, ancillary and laboratory) are listed below for reference.    Significant Diagnostic Studies: DG Chest 2 View  Result Date: 05/21/2020 CLINICAL DATA:  Shortness of breath, high blood pressure, smoker EXAM: CHEST - 2 VIEW COMPARISON:  05/10/2020 FINDINGS: Enlargement of cardiac silhouette with slight vascular congestion. Mediastinal contours  normal. Chronic eventration of LEFT diaphragm. Persistent peribronchial thickening with bibasilar atelectasis, little changed. No definite acute infiltrate, pleural effusion or pneumothorax. Osseous structures unremarkable. IMPRESSION: Enlargement of cardiac silhouette. Chronic bronchitic changes and bibasilar atelectasis. No acute abnormalities. Electronically Signed   By: Ulyses Southward M.D.   On: 05/21/2020 17:07   DG Chest 2 View  Result Date: 05/10/2020 CLINICAL DATA:  Shortness of breath and cough.  Current smoker. EXAM: CHEST - 2 VIEW COMPARISON:  Radiograph 04/19/2020.  CT 04/14/2020. FINDINGS: Chronic elevation of left hemidiaphragm. Persistent low lung volumes. The heart is enlarged, unchanged from prior exam. Linear atelectasis/scarring in the lung but and left lower lobe. Subsegmental atelectasis in the right lower lobe. Peribronchial thickening, similar prior. No pneumothorax or large pleural effusion. Degenerative change in the spine. IMPRESSION: 1. Low lung volumes with chronic elevation of left hemidiaphragm. Chronic atelectasis/scarring in the lung bases. 2. Chronic peribronchial thickening likely related to underlying smoking. 3. Stable cardiomegaly. Electronically Signed   By: Narda Rutherford M.D.   On: 05/10/2020 21:07   CT HEAD WO CONTRAST  Result Date: 05/23/2020 CLINICAL DATA:  Delirium with fall and head injury. EXAM: CT HEAD WITHOUT CONTRAST TECHNIQUE: Contiguous axial images were obtained from the base of the skull through the vertex without intravenous contrast.  COMPARISON:  None. FINDINGS: Brain: No evidence of acute infarction, hemorrhage, hydrocephalus, extra-axial collection or mass lesion/mass effect. Chronic small vessel ischemia with chronic lacune in the subcortical left frontal lobe towards the vertex. Cerebral volume loss without specific pattern. Vascular: No hyperdense vessel or unexpected calcification. Skull: Normal. Negative for fracture or focal lesion.  Sinuses/Orbits: No acute finding. Nasopharyngeal fluid in the setting of intubation. IMPRESSION: 1. No evidence of intracranial injury. 2. Atrophy and chronic small vessel disease. Electronically Signed   By: Marnee Spring M.D.   On: 05/23/2020 05:27   DG CHEST PORT 1 VIEW  Result Date: 05/26/2020 CLINICAL DATA:  Pulmonary congestion and shortness of breath. EXAM: PORTABLE CHEST 1 VIEW COMPARISON:  05/25/2020 FINDINGS: The endotracheal tube and NG tube have been removed. Bilateral airspace opacities are again identified with mild bibasilar atelectasis. The cardiomediastinal silhouette is unchanged. No evidence of pneumothorax or large pleural effusion. IMPRESSION: Endotracheal tube and NG tube removal with continued bilateral airspace opacities and mild bibasilar atelectasis. Electronically Signed   By: Harmon Pier M.D.   On: 05/26/2020 09:05   DG CHEST PORT 1 VIEW  Result Date: 05/25/2020 CLINICAL DATA:  Acute respiratory failure, hypoxia EXAM: PORTABLE CHEST 1 VIEW COMPARISON:  05/24/2020 FINDINGS: Single frontal view of the chest demonstrates endotracheal tube overlying tracheal air column 2.7 cm above carina. Enteric catheter tip and side port project over gastric body. Cardiac silhouette is enlarged but stable. Lung volumes are diminished, with continued diffuse interstitial prominence and bibasilar consolidation. Trace left effusion is noted. No pneumothorax. No acute bony abnormalities. IMPRESSION: 1. Low lung volumes, with stable superimposed interstitial and alveolar opacities likely representing edema. 2. Stable support devices. Electronically Signed   By: Sharlet Salina M.D.   On: 05/25/2020 03:52   DG CHEST PORT 1 VIEW  Result Date: 05/24/2020 CLINICAL DATA:  Acute respiratory failure with hypoxia. EXAM: PORTABLE CHEST 1 VIEW COMPARISON:  05/23/2020 FINDINGS: 0453 hours. Low lung volumes. Endotracheal tube tip is 4.4 cm above the base of the carina. NG tube tip is in the mid stomach. The  cardio pericardial silhouette is enlarged. There is pulmonary vascular congestion without overt pulmonary edema. Streaky and patchy airspace disease is noted in the lower lungs bilaterally, left greater than right and similar to prior. Tiny bilateral pleural effusions. The visualized bony structures of the thorax show no acute abnormality. Telemetry leads overlie the chest. IMPRESSION: 1. Low volume film with stable exam. 2. Similar diffuse interstitial opacity with basilar collapse/consolidative disease, left greater than right. 3. Tiny bilateral pleural effusions. Electronically Signed   By: Kennith Center M.D.   On: 05/24/2020 07:14   Portable Chest x-ray  Result Date: 05/23/2020 CLINICAL DATA:  Intubation EXAM: PORTABLE CHEST 1 VIEW COMPARISON:  Two days ago FINDINGS: New endotracheal tube with tip between the clavicular heads and carina. The enteric tube reaches the stomach. Artifact from EKG leads. Lower lung volumes and increased airspace disease. There could be trace pleural fluid on the left. No air leak is seen. Cardiomegaly accentuated by mediastinal fat. IMPRESSION: 1. New hardware in unremarkable position. 2. Worsening aeration and airspace disease. Electronically Signed   By: Marnee Spring M.D.   On: 05/23/2020 05:03    Microbiology: No results found for this or any previous visit (from the past 240 hour(s)).   Labs: Basic Metabolic Panel: No results for input(s): NA, K, CL, CO2, GLUCOSE, BUN, CREATININE, CALCIUM, MG, PHOS in the last 168 hours. Liver Function Tests: No results for input(s): AST,  ALT, ALKPHOS, BILITOT, PROT, ALBUMIN in the last 168 hours. No results for input(s): LIPASE, AMYLASE in the last 168 hours. No results for input(s): AMMONIA in the last 168 hours. CBC: No results for input(s): WBC, NEUTROABS, HGB, HCT, MCV, PLT in the last 168 hours. Cardiac Enzymes: No results for input(s): CKTOTAL, CKMB, CKMBINDEX, TROPONINI in the last 168 hours. BNP: BNP (last 3  results) Recent Labs    04/14/20 0523 04/19/20 1035 05/22/20 0343  BNP 112.7* 131.5* 269.7*    ProBNP (last 3 results) No results for input(s): PROBNP in the last 8760 hours.  CBG: No results for input(s): GLUCAP in the last 168 hours.     Signed:  Zannie Cove MD.  [facility] 06/07/2020, 3:13 PM

## 2020-06-16 ENCOUNTER — Emergency Department (HOSPITAL_COMMUNITY)
Admission: EM | Admit: 2020-06-16 | Discharge: 2020-06-16 | Disposition: A | Discharge status: Home / Self Care | Payer: Medicare Other | Attending: Emergency Medicine | Admitting: Emergency Medicine

## 2020-06-16 ENCOUNTER — Emergency Department (HOSPITAL_COMMUNITY): Payer: Medicare Other

## 2020-06-16 DIAGNOSIS — Z20822 Contact with and (suspected) exposure to covid-19: Secondary | ICD-10-CM | POA: Diagnosis not present

## 2020-06-16 DIAGNOSIS — R0602 Shortness of breath: Secondary | ICD-10-CM | POA: Diagnosis present

## 2020-06-16 DIAGNOSIS — I10 Essential (primary) hypertension: Secondary | ICD-10-CM | POA: Insufficient documentation

## 2020-06-16 DIAGNOSIS — F17200 Nicotine dependence, unspecified, uncomplicated: Secondary | ICD-10-CM | POA: Diagnosis not present

## 2020-06-16 DIAGNOSIS — Z79899 Other long term (current) drug therapy: Secondary | ICD-10-CM | POA: Diagnosis not present

## 2020-06-16 DIAGNOSIS — J441 Chronic obstructive pulmonary disease with (acute) exacerbation: Secondary | ICD-10-CM | POA: Insufficient documentation

## 2020-06-16 LAB — CBC WITH DIFFERENTIAL/PLATELET
Abs Immature Granulocytes: 0.05 10*3/uL (ref 0.00–0.07)
Basophils Absolute: 0 10*3/uL (ref 0.0–0.1)
Basophils Relative: 1 %
Eosinophils Absolute: 0.1 10*3/uL (ref 0.0–0.5)
Eosinophils Relative: 1 %
HCT: 40 % (ref 39.0–52.0)
Hemoglobin: 11.9 g/dL — ABNORMAL LOW (ref 13.0–17.0)
Immature Granulocytes: 1 %
Lymphocytes Relative: 10 %
Lymphs Abs: 0.9 10*3/uL (ref 0.7–4.0)
MCH: 30.6 pg (ref 26.0–34.0)
MCHC: 29.8 g/dL — ABNORMAL LOW (ref 30.0–36.0)
MCV: 102.8 fL — ABNORMAL HIGH (ref 80.0–100.0)
Monocytes Absolute: 0.9 10*3/uL (ref 0.1–1.0)
Monocytes Relative: 10 %
Neutro Abs: 7 10*3/uL (ref 1.7–7.7)
Neutrophils Relative %: 77 %
Platelets: 273 10*3/uL (ref 150–400)
RBC: 3.89 MIL/uL — ABNORMAL LOW (ref 4.22–5.81)
RDW: 16.8 % — ABNORMAL HIGH (ref 11.5–15.5)
WBC: 8.9 10*3/uL (ref 4.0–10.5)
nRBC: 0 % (ref 0.0–0.2)

## 2020-06-16 LAB — RESPIRATORY PANEL BY RT PCR (FLU A&B, COVID)
Influenza A by PCR: NEGATIVE
Influenza B by PCR: NEGATIVE
SARS Coronavirus 2 by RT PCR: NEGATIVE

## 2020-06-16 LAB — COMPREHENSIVE METABOLIC PANEL
ALT: 15 U/L (ref 0–44)
AST: 16 U/L (ref 15–41)
Albumin: 3.4 g/dL — ABNORMAL LOW (ref 3.5–5.0)
Alkaline Phosphatase: 51 U/L (ref 38–126)
Anion gap: 11 (ref 5–15)
BUN: 6 mg/dL — ABNORMAL LOW (ref 8–23)
CO2: 25 mmol/L (ref 22–32)
Calcium: 9 mg/dL (ref 8.9–10.3)
Chloride: 104 mmol/L (ref 98–111)
Creatinine, Ser: 1.07 mg/dL (ref 0.61–1.24)
GFR calc Af Amer: >60 mL/min (ref >60)
GFR calc non Af Amer: >60 mL/min (ref >60)
Glucose, Bld: 110 mg/dL — ABNORMAL HIGH (ref 70–99)
Potassium: 3.8 mmol/L (ref 3.5–5.1)
Sodium: 140 mmol/L (ref 135–145)
Total Bilirubin: 0.9 mg/dL (ref 0.3–1.2)
Total Protein: 6.5 g/dL (ref 6.5–8.1)

## 2020-06-16 LAB — TROPONIN I (HIGH SENSITIVITY)
Troponin I (High Sensitivity): 26 ng/L — ABNORMAL HIGH (ref <18)
Troponin I (High Sensitivity): 19 ng/L — ABNORMAL HIGH (ref <18)

## 2020-06-16 LAB — BRAIN NATRIURETIC PEPTIDE: B Natriuretic Peptide: 125.7 pg/mL — ABNORMAL HIGH (ref 0.0–100.0)

## 2020-06-16 MED ORDER — IPRATROPIUM BROMIDE HFA 17 MCG/ACT IN AERS
4.0000 | INHALATION_SPRAY | Freq: Once | RESPIRATORY_TRACT | Status: AC
  Administered 2020-06-16: 4 via RESPIRATORY_TRACT

## 2020-06-16 MED ORDER — IPRATROPIUM BROMIDE HFA 17 MCG/ACT IN AERS
2.0000 | INHALATION_SPRAY | Freq: Once | RESPIRATORY_TRACT | Status: DC
  Filled 2020-06-16: qty 12.9

## 2020-06-16 MED ORDER — METHYLPREDNISOLONE SODIUM SUCC 125 MG IJ SOLR
125.0000 mg | Freq: Once | INTRAMUSCULAR | Status: AC
  Administered 2020-06-16: 125 mg via INTRAVENOUS
  Filled 2020-06-16: qty 2

## 2020-06-16 MED ORDER — ALBUTEROL SULFATE HFA 108 (90 BASE) MCG/ACT IN AERS
8.0000 | INHALATION_SPRAY | Freq: Once | RESPIRATORY_TRACT | Status: AC
  Administered 2020-06-16: 8 via RESPIRATORY_TRACT

## 2020-06-16 MED ORDER — MAGNESIUM SULFATE 2 GM/50ML IV SOLN
2.0000 g | Freq: Once | INTRAVENOUS | Status: AC
  Administered 2020-06-16: 2 g via INTRAVENOUS
  Filled 2020-06-16: qty 50

## 2020-06-16 MED ORDER — ALBUTEROL SULFATE HFA 108 (90 BASE) MCG/ACT IN AERS
2.0000 | INHALATION_SPRAY | RESPIRATORY_TRACT | Status: DC | PRN
  Filled 2020-06-16: qty 6.7

## 2020-06-16 MED ORDER — PREDNISONE 10 MG PO TABS: 50.0000 mg | ORAL_TABLET | Freq: Every day | ORAL | 0 refills | Status: DC

## 2020-06-16 MED ORDER — AEROCHAMBER PLUS FLO-VU LARGE MISC
1.0000 | Freq: Once | Status: AC
  Administered 2020-06-16: 1

## 2020-06-16 NOTE — ED Triage Notes (Signed)
BIB by [facility] after pt called to report c/o increase SHOB. According to EMS, pt has has increase SHOB over last 3 days with abdominal pain.EMS placed pt on 3 L [place]. PT has hx HTN and uses 02 concentrator at baseline. Pt currently resides at [facility]

## 2020-06-16 NOTE — ED Provider Notes (Signed)
[facility]   CSN: [id number] Arrival date & time: 06/16/20  [id number]     History Chief Complaint  Patient presents with  . Shortness of Breath    William Hansenric T Hutzler is a 64 y.o. male.  Pt is a 64y/o male with hx of homelessness, COPD, chronic respiratory failure on 3 L home O2, lives [facility], alcohol abuse who was hospitalized at the beginning of September for COPD exacerbation but then had alcohol withdrawal resulting in altered mental status, DTs, intubation on Precedex who was discharged less than 2 weeks ago who is presenting today with worsening shortness of breath.  Patient reports that he went to the [facility] yesterday and his blood pressure was elevated and he has been having shortness of breath for the last 2 to 3 days.  He reports when he was there they wanted to admit him but he refused and stated he wanted to come back to [place].  Throughout the night the shortness of breath has worsened.  He reports that he had some left side pain earlier this morning but it is now resolved.  He is very vague on the details of where the pain was what he was doing while he was having it and if he has had it in the past.  He does report nonproductive cough and wheezing.  He has been wearing his 3 L of oxygen constantly and has not had fever.  He has not noticed significant swelling in his lower extremities but does report he is trying to take his medications more regularly but has missed a few doses.  He did take his medication this morning prior to arrival.  Patient was transported by EMS but did not receive any medications in route.  The history is provided by the patient and the EMS personnel.  Shortness of Breath      Past Medical History:  Diagnosis Date  . H/O ETOH abuse   . Hypertension     Patient Active Problem List   Diagnosis Date Noted  . Acute respiratory failure with hypoxia (HCC)   . Chronic respiratory failure with  hypoxia (HCC) 05/22/2020  . Elevated troponin 05/22/2020  . Diastolic dysfunction 05/22/2020  . Homelessness 05/22/2020  . Tobacco abuse 05/22/2020  . Uncontrolled hypertension   . Dyspnea 04/14/2020  . Hypertensive urgency   . COPD exacerbation (HCC)   . ETOH abuse 11/14/2019  . Pneumonia due to COVID-19 virus 11/14/2019    No past surgical history on file.     No family history on file.  Social History   Tobacco Use  . Smoking status: Current Every Day Smoker    Packs/day: 1.00  . Smokeless tobacco: Never Used  Substance Use Topics  . Alcohol use: Yes    Comment: 4 beers per day  . Drug use: Not Currently    Home Medications Prior to Admission medications   Medication Sig Start Date End Date Taking? Authorizing Provider  acetaminophen (TYLENOL) 325 MG tablet Take 2 tablets (650 mg total) by mouth every 6 (six) hours as needed for mild pain or headache (fever >/= 101). 11/21/19   Lonia BloodMcClung, Jeffrey T, MD  albuterol (VENTOLIN HFA) 108 (90 Base) MCG/ACT inhaler Inhale 2 puffs into the lungs every 6 (six) hours as needed for wheezing or shortness of breath. 04/18/20   Pashayan, Mardelle MatteAlexander S, MD  diclofenac Sodium (VOLTAREN) 1 % GEL Apply 4 g topically 4 (four) times daily as needed (right  knee pain).    [provider]  hydrALAZINE (APRESOLINE) 100 MG tablet Take 1 tablet (100 mg total) by mouth every 8 (eight) hours. 11/21/19   Lonia Blood, MD  isosorbide mononitrate (IMDUR) 30 MG 24 hr tablet Take 1 tablet (30 mg total) by mouth daily. 05/29/20   Zannie Cove, MD  losartan (COZAAR) 25 MG tablet Take 1 tablet (25 mg total) by mouth daily. 04/19/20   Lauro Franklin, MD  metoprolol tartrate (LOPRESSOR) 25 MG tablet Take 0.5 tablets (12.5 mg total) by mouth 2 (two) times daily. 11/21/19   Lonia Blood, MD  umeclidinium bromide (INCRUSE ELLIPTA) 62.5 MCG/INH AEPB Inhale 1 puff into the lungs daily. 04/18/20   Lauro Franklin, MD    Allergies      Patient has no known allergies.  Review of Systems   Review of Systems  Respiratory: Positive for shortness of breath.   All other systems reviewed and are negative.   Physical Exam Updated Vital Signs BP 135/79 (BP Location: Right Arm)   Pulse (!) 101   Temp 98.3 F (36.8 C) (Oral)   Resp 16   SpO2 97%   Physical Exam Vitals and nursing note reviewed.  Constitutional:      General: He is not in acute distress.    Appearance: He is well-developed. He is obese.  HENT:     Head: Normocephalic and atraumatic.  Eyes:     Conjunctiva/sclera: Conjunctivae normal.     Pupils: Pupils are equal, round, and reactive to light.  Cardiovascular:     Rate and Rhythm: Regular rhythm. Tachycardia present.     Pulses: Normal pulses.     Heart sounds: No murmur heard.   Pulmonary:     Effort: Pulmonary effort is normal. Tachypnea present. No respiratory distress.     Breath sounds: Wheezing present. No rales.  Chest:     Chest wall: No tenderness.  Abdominal:     General: There is no distension.     Palpations: Abdomen is soft.     Tenderness: There is no abdominal tenderness. There is no guarding or rebound.  Musculoskeletal:        General: No tenderness. Normal range of motion.     Cervical back: Normal range of motion and neck supple.     Right lower leg: No edema.     Left lower leg: No edema.     Comments: No notable swelling in the lower extremities  Skin:    General: Skin is warm and dry.     Findings: No erythema or rash.  Neurological:     General: No focal deficit present.     Mental Status: He is alert and oriented to person, place, and time.  Psychiatric:        Mood and Affect: Mood normal.        Behavior: Behavior normal.     ED Results / Procedures / Treatments   Labs (all labs ordered are listed, but only abnormal results are displayed) Labs Reviewed  CBC WITH DIFFERENTIAL/PLATELET - Abnormal; Notable for the following components:      Result Value    RBC 3.89 (*)    Hemoglobin 11.9 (*)    MCV 102.8 (*)    MCHC 29.8 (*)    RDW 16.8 (*)    All other components within normal limits  BRAIN NATRIURETIC PEPTIDE - Abnormal; Notable for the following components:   B Natriuretic Peptide 125.7 (*)  All other components within normal limits  COMPREHENSIVE METABOLIC PANEL - Abnormal; Notable for the following components:   Glucose, Bld 110 (*)    BUN 6 (*)    Albumin 3.4 (*)    All other components within normal limits  TROPONIN I (HIGH SENSITIVITY) - Abnormal; Notable for the following components:   Troponin I (High Sensitivity) 26 (*)    All other components within normal limits  RESPIRATORY PANEL BY RT PCR (FLU A&B, COVID)    EKG EKG Interpretation  Date/Time:  Friday June 16 2020 06:55:29 EDT Ventricular Rate:  104 PR Interval:    QRS Duration: 69 QT Interval:  360 QTC Calculation: 474 R Axis:   36 Text Interpretation: Sinus tachycardia Probable left atrial enlargement Abnormal R-wave progression, early transition Probable left ventricular hypertrophy When compared with ECG of 05/22/2020, No significant change was found Confirmed by Dione Booze [phone]) on 06/16/2020 6:57:34 AM   Radiology DG Chest Port 1 View  Result Date: 06/16/2020 CLINICAL DATA:  Shortness of breath and hypertension EXAM: PORTABLE CHEST 1 VIEW COMPARISON:  05/26/2020 FINDINGS: Cardiomegaly and vascular pedicle widening. Linear scarring at the left base. Lung volumes are low and there is chronic interstitial coarsening. No Kerley lines, effusion, or pneumothorax. Artifact from EKG leads IMPRESSION: 1. No acute finding when compared to prior. 2. Low volume chest with cardiomegaly and left pulmonary scarring. Electronically Signed   By: Marnee Spring M.D.   On: 06/16/2020 09:02    Procedures Procedures (including critical care time)  Medications Ordered in ED Medications - No data to display  ED Course  I have reviewed the triage vital signs and the  nursing notes.  Pertinent labs & imaging results that were available during my care of the patient were reviewed by me and considered in my medical decision making (see chart for details).    MDM Rules/Calculators/A&P                          Patient with multiple medical problems on chronic oxygen therapy living at [facility] house presenting with 2 to 3 days of worsening shortness of breath.  Patient is wheezing and tachypneic on exam but able to speak in short sentences.  Oxygen saturation is in the mid to high 90s but with talking will sometimes drop to the high 80s.  Patient did report some left side pain but difficult to discern if this was chest pain or abdominal pain.  He denies any pain at this time and states it went away on its own.  Patient does have history of hypertensive urgency and CHF but echo done from several months ago showed a EF of 65 to 70%.  Patient did take his medications prior to arrival today and blood pressure is 161/98.  He has no significant findings for fluid overload but will check a troponin and BMP given the reported vague pain.  EKG today without significant changes.  Patient also reports a cough and suspect this is a COPD exacerbation.  He has not had fever or productive sputum concerning for pneumonia.  Patient symptoms are not classic for Covid.  He has been vaccinated. Patient was given Solu-Medrol, magnesium, albuterol and Atrovent.  Will reassess after meds are given.  Labs and imaging pending.   9:38 AM On reevaluation patient has better air movement reports he feels like he is breathing better but still having wheezing and was given a second round of albuterol and Atrovent.  CBC without acute changes, BNP only mildly elevated at 125, chest x-ray within normal limits, troponin and BMP pending.  12:05 PM After the second albuterol and Atrovent treatment patient's wheezing has significantly improved. He reports he feels like his breathing is better. CMP is  reassuring. Initial troponin is 26 which appears to be his baseline. Will recheck delta troponin but if it is normal we will ambulate the patient and if his breathing is still okay will discharge home to continue inhalers and prednisone.   Final Clinical Impression(s) / ED Diagnoses Final diagnoses:  None    Rx / DC Orders ED Discharge Orders    None       Gwyneth Sprout, MD 06/23/20 1432

## 2020-06-16 NOTE — ED Provider Notes (Signed)
64 year old male w/ hx of COPD and CHF presenting with dyspnea.  Hx of DT and intubation in the recent past.  Here was wheezing and requiring 3L [place] (at baseline), has received inhalers here with symptomatic improvement.  Pending 2nd troponin level (first is mildly elevated at 26 but close to baseline).  If 2nd troponin is okay, can ambulate   He wears 2-3L [place] at all times  6 pm - ambulated and felt breathing was better, will discharge with prednisone per Dr Plunkett's plan   Renaye Rakers Kermit Balo, MD 06/16/20 [phone]

## 2020-06-16 NOTE — ED Notes (Signed)
D/c instructions and scrips reviewed with the pt. Pt verbalized understanding of instructions and scrips.

## 2020-06-16 NOTE — ED Notes (Signed)
Pt ambulated without assistance.  Maintained an 02 sat of 97-100% while ambulating on 3L.  sats 92-93% on 3L once back in bed.

## 2020-06-21 ENCOUNTER — Encounter: Payer: Self-pay | Admitting: Critical Care Medicine

## 2020-06-21 ENCOUNTER — Other Ambulatory Visit: Payer: Self-pay | Admitting: Critical Care Medicine

## 2020-06-21 MED ORDER — ISOSORBIDE MONONITRATE ER 30 MG PO TB24: 30.0000 mg | ORAL_TABLET | Freq: Every day | ORAL | 0 refills | Status: AC

## 2020-06-21 MED ORDER — PREDNISONE 10 MG PO TABS: 50.0000 mg | ORAL_TABLET | Freq: Every day | ORAL | 0 refills | Status: AC

## 2020-06-21 MED ORDER — ALBUTEROL SULFATE HFA 108 (90 BASE) MCG/ACT IN AERS: 2.0000 | INHALATION_SPRAY | Freq: Four times a day (QID) | RESPIRATORY_TRACT | 0 refills | Status: AC | PRN

## 2020-06-21 MED ORDER — LOSARTAN POTASSIUM 25 MG PO TABS: 25.0000 mg | ORAL_TABLET | Freq: Every day | ORAL | 0 refills | Status: AC

## 2020-06-21 MED ORDER — METOPROLOL TARTRATE 25 MG PO TABS: 25.0000 mg | ORAL_TABLET | Freq: Two times a day (BID) | ORAL | 1 refills | Status: AC

## 2020-06-21 MED FILL — ALBUTEROL SULFATE HFA 108 (: 108 (90 BAS | 25 days supply | Qty: 18 | Fill #0

## 2020-06-21 MED FILL — predniSONE 10 MG TABS: 10 | 5 days supply | Qty: 25 | Fill #0

## 2020-06-21 MED FILL — LOSARTAN POTASSIUM 25 MG TA: 25 | 30 days supply | Qty: 30 | Fill #0

## 2020-06-21 MED FILL — METOPROLOL TARTRATE 25 MG T: 25 | 30 days supply | Qty: 60 | Fill #0

## 2020-06-21 MED FILL — ISOSORBIDE MN ER 30 MG TAB: 30 | 30 days supply | Qty: 30 | Fill #0

## 2020-06-22 ENCOUNTER — Encounter: Payer: Self-pay | Admitting: Pediatric Intensive Care

## 2020-06-22 NOTE — Congregational Nurse Program (Signed)
  Dept: [phone]   Congregational Nurse Program Note  Date of Encounter: 06/22/2020  Past Medical History: Past Medical History:  Diagnosis Date  . H/O ETOH abuse   . Hypertension     Encounter Details:Medication to client. CN advised client to keep albuterol inhaler on his person. Client will need reinforcement regarding his discus medication.  Shann Medal RN BSN CNP [phone]

## 2020-06-22 NOTE — Progress Notes (Signed)
Patient ID: William Pruitt, male   DOB: 1956-09-10, 64 y.o.   MRN: [id number] This is a 64 year old male with advanced COPD recently hospitalized for same.  On discharge he was given a course of prednisone which he is yet to fill.  He comes in today with home oxygen running at 3 L.  He has not been using his inhalers properly as well.  I obtained his medication bag and found that his inhaler was unopened and his prednisone prescription was not filled.  On exam blood pressure 168/97 pulse 98 saturation 97% on 3 L chest show distant breath sounds cardiac exam unremarkable  Impression data COPD with poor access to medications  Plan is to refill his albuterol inhaler, Imdur, losartan, metoprolol, prednisone, and I instructed Mr. The proper use of his Incruse inhaler

## 2020-06-28 ENCOUNTER — Telehealth: Payer: Self-pay

## 2020-06-28 NOTE — Telephone Encounter (Signed)
Call placed to [facility] regarding FL2. She explained that they are hoping to place the patient at [facility].  He is currently supposed to be staying at a rooming house but he is not always there

## 2020-06-29 NOTE — Telephone Encounter (Signed)
This CM spoke to [employer] who stated that she already faxed the [facility] to [facility].  Call placed to [facility] # [phone], spoke to [facility] who stated that they received the [facility] and are reviewing it.

## 2020-07-24 DEATH — deceased

## 2020-09-07 IMAGING — DX DG CHEST 1V PORT
1 series · 1 of 1 positions shown · non-contrast
Comparison: 02/10/2020

CLINICAL DATA: Short of breath for 2 days

EXAM:
PORTABLE CHEST 1 VIEW

[chest]
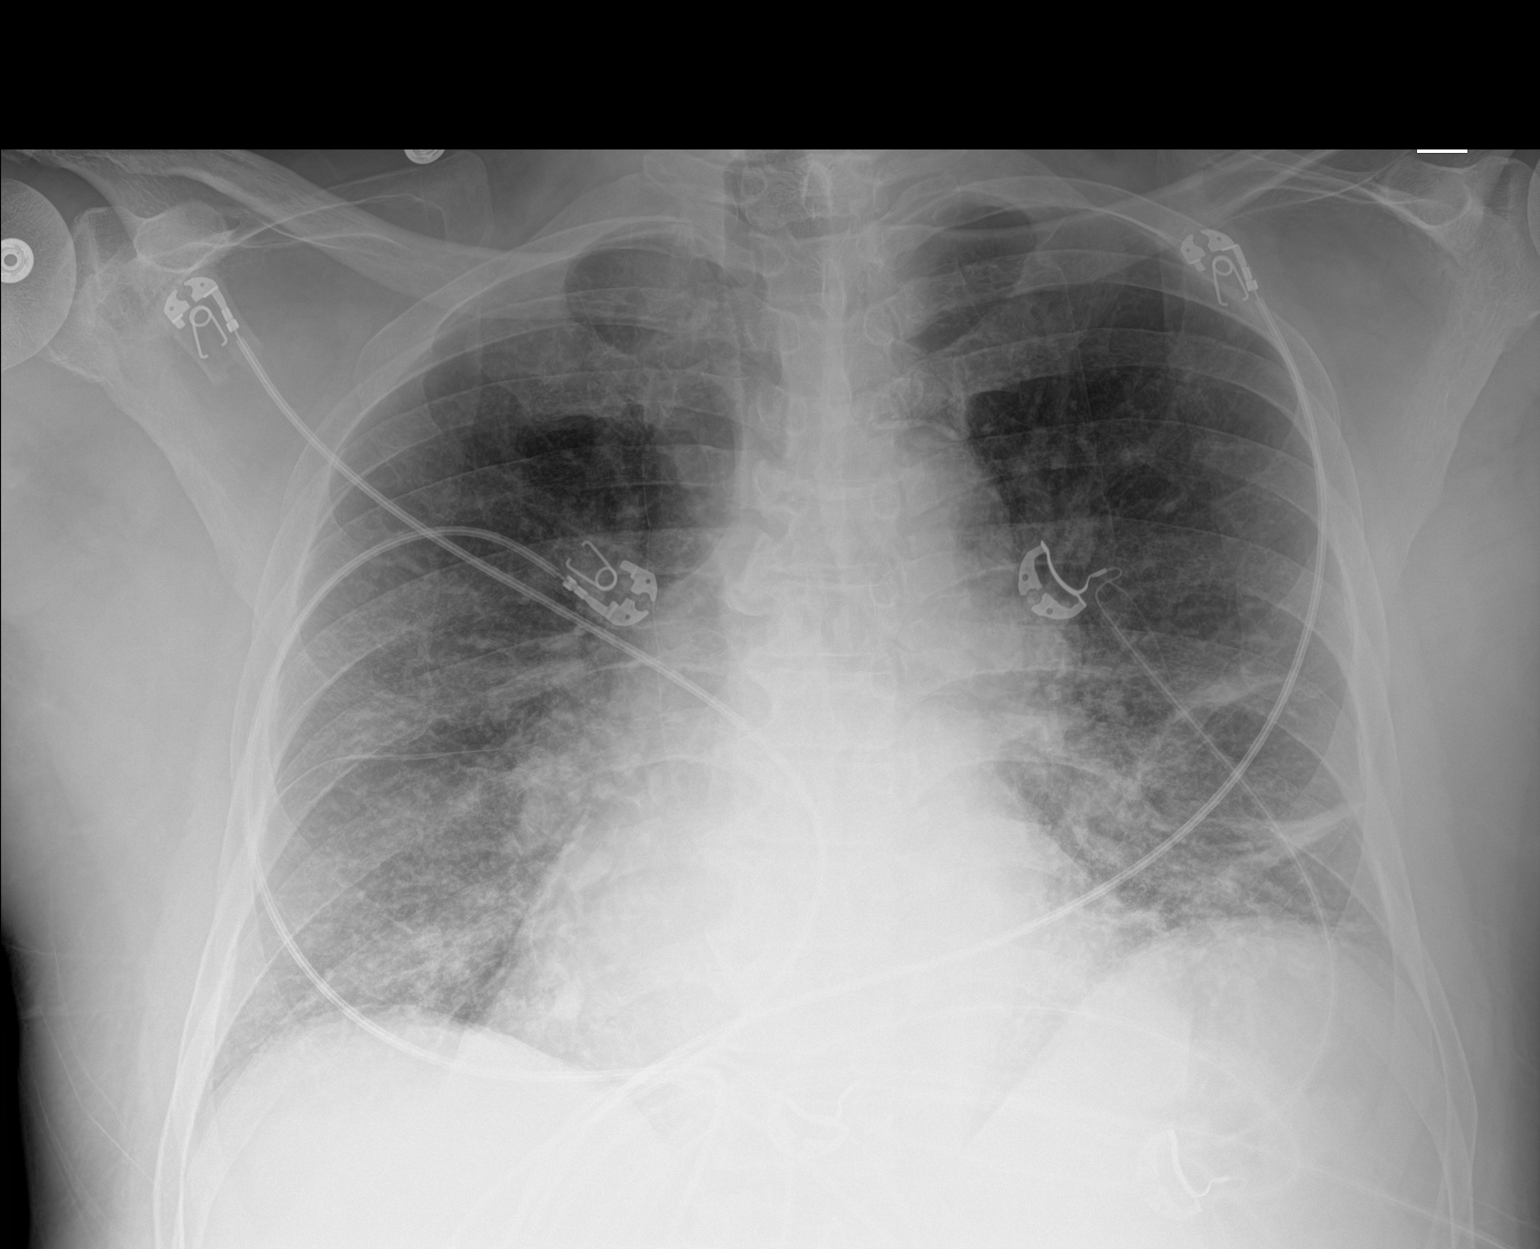

[1 of 1 positions shown; findings below may reference images not displayed]

FINDINGS: Single frontal view of the chest demonstrates a stable cardiac
silhouette. Continued ectasia of the thoracic aorta with mild
atherosclerosis of the aortic arch. There is increased central
vascular congestion, with basilar predominant interstitial and
ground-glass opacities most consistent with mild edema. Chronic
scarring or atelectasis at the left base. No effusion or
pneumothorax. No acute bony abnormalities.
IMPRESSION: 1. Findings consistent with mild pulmonary edema.

## 2020-09-12 IMAGING — CR DG CHEST 2V
2 series · 2 of 2 positions shown · non-contrast
Comparison: April 14, 2020 chest radiograph and chest CT

CLINICAL DATA: Shortness of breath and tachypnea

EXAM:
CHEST - 2 VIEW

[chest lat]
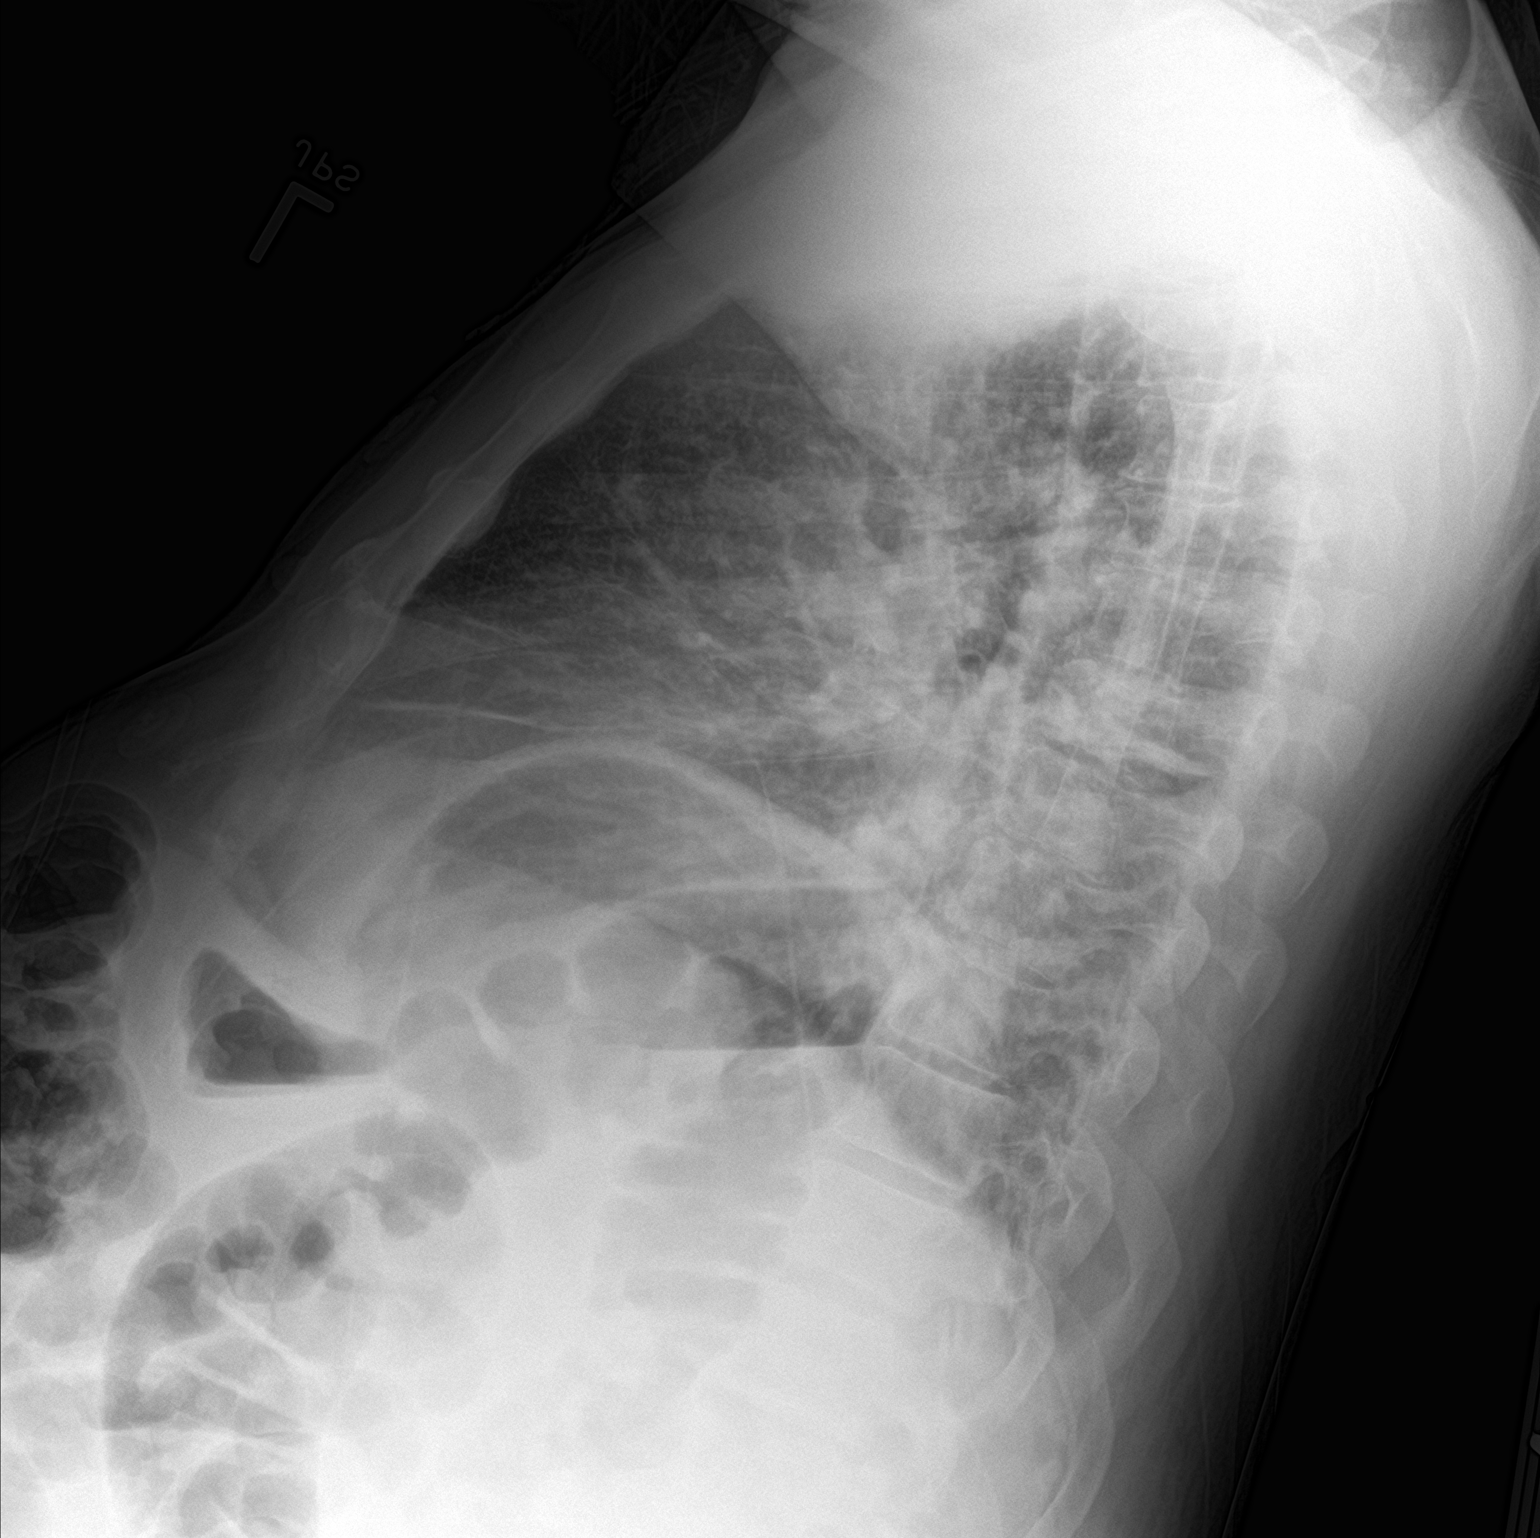

[chest ap]
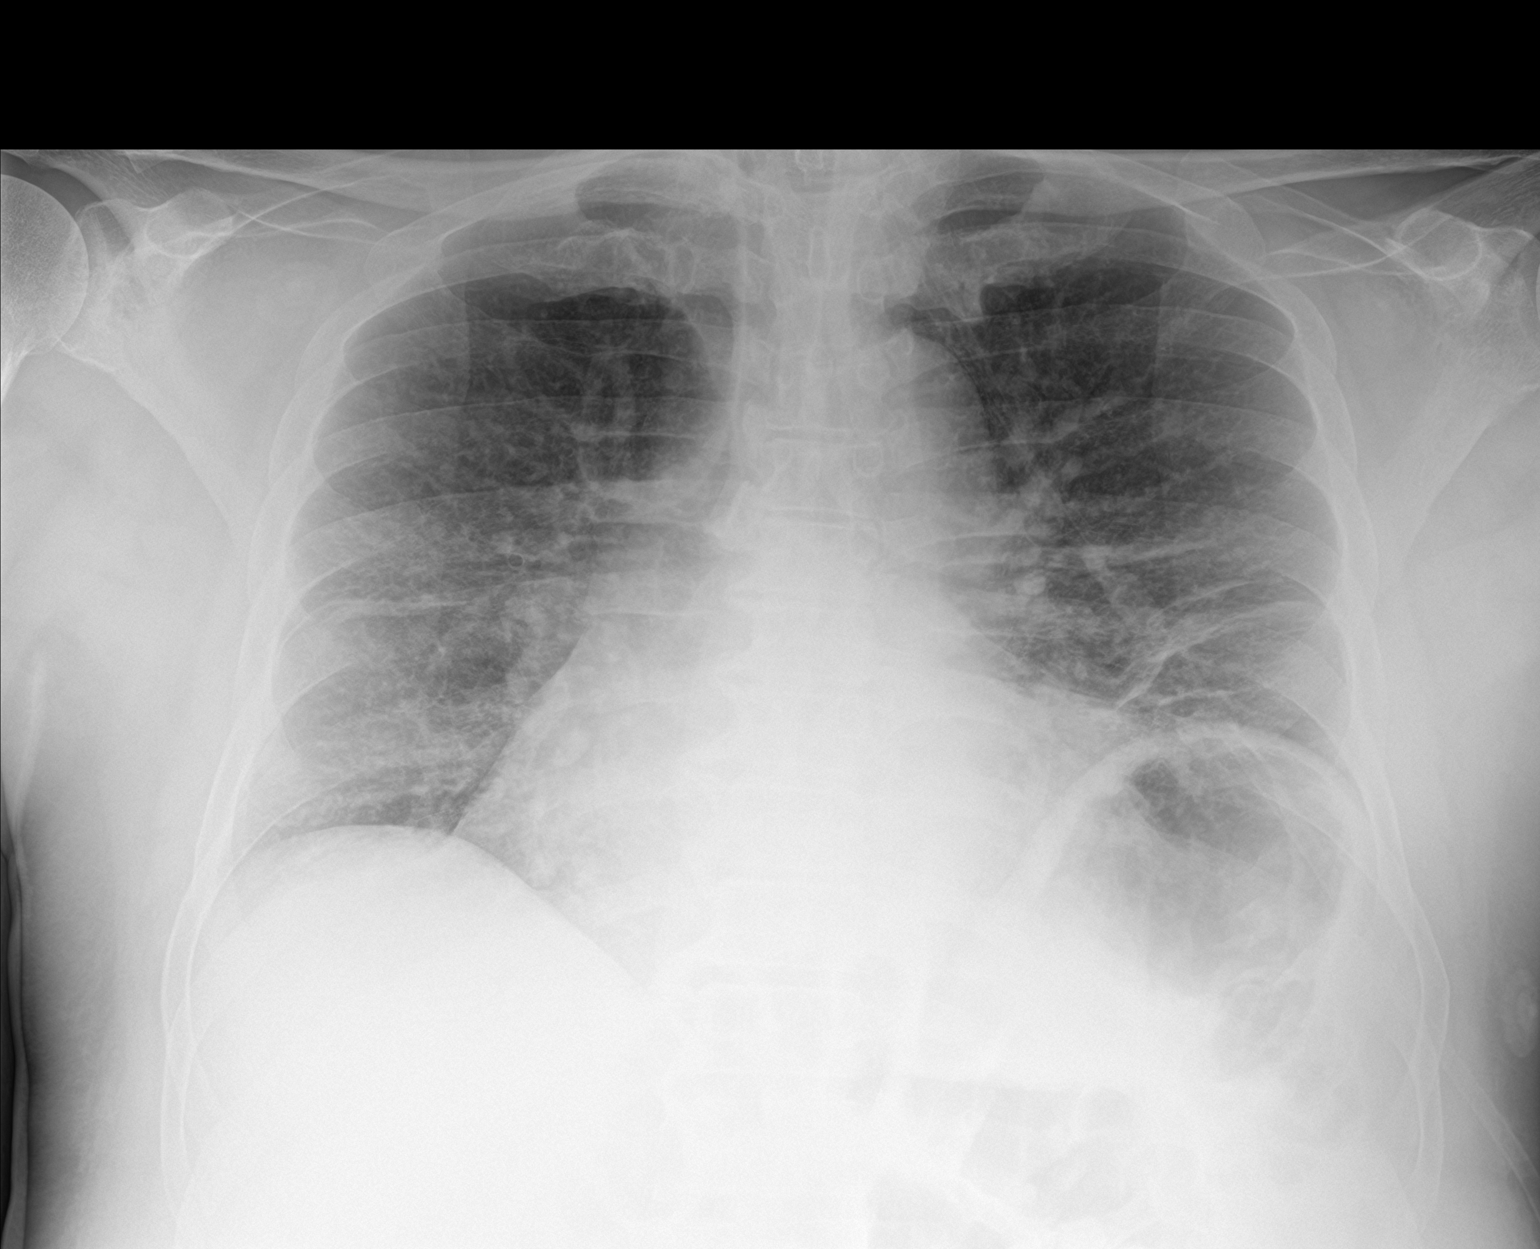

[2 of 2 positions shown; findings below may reference images not displayed]

FINDINGS: There is persistent atelectatic change in the left lower lung
region. There is no frank edema or airspace opacity. Heart is upper
normal in size with pulmonary vascularity normal. No adenopathy. No
bone lesions.
IMPRESSION: Left lower lung region atelectasis. No frank edema or consolidation.
Stable cardiac silhouette. No adenopathy.

## 2020-10-17 IMAGING — DX DG CHEST 1V PORT
1 series · 1 of 1 positions shown · non-contrast
Comparison: 05/23/2020

CLINICAL DATA: Acute respiratory failure with hypoxia.

EXAM:
PORTABLE CHEST 1 VIEW

[chest]
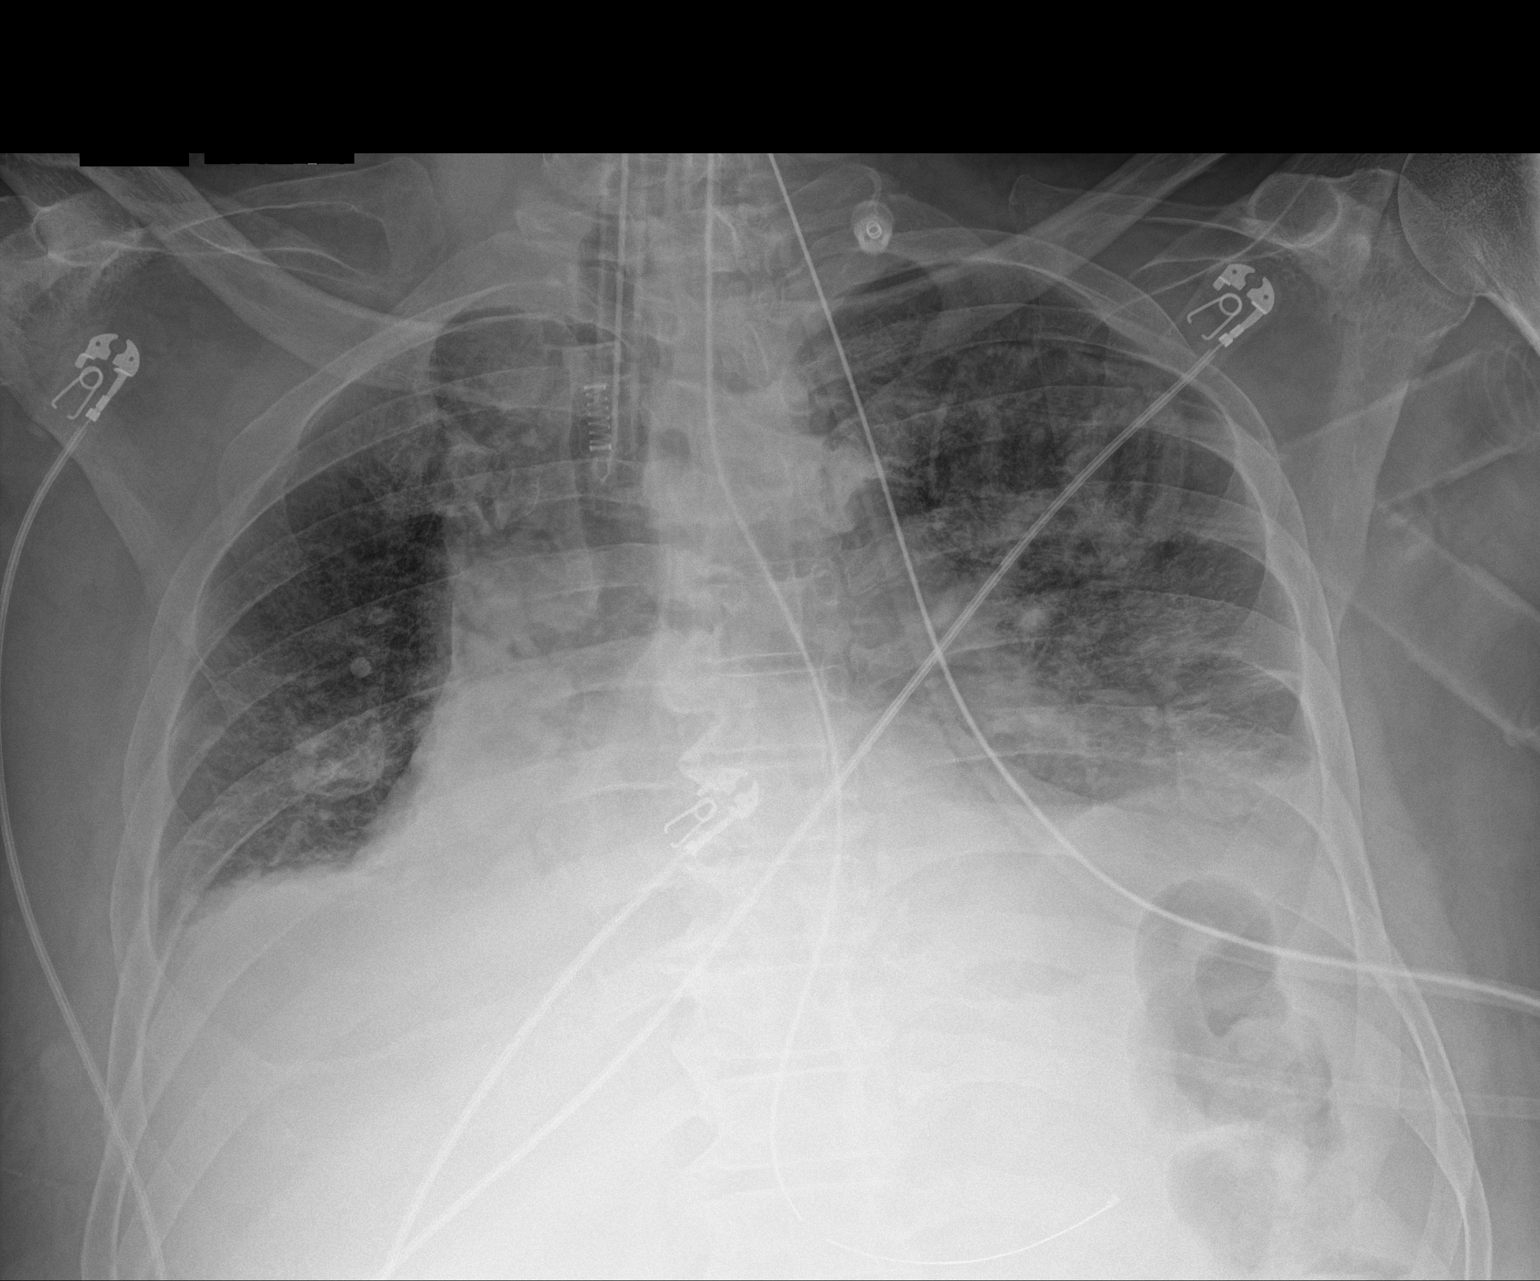

[1 of 1 positions shown; findings below may reference images not displayed]

FINDINGS: 7186 hours. Low lung volumes. Endotracheal tube tip is 4.4 cm above
the base of the carina. NG tube tip is in the mid stomach. The
cardio pericardial silhouette is enlarged. There is pulmonary
vascular congestion without overt pulmonary edema. Streaky and
patchy airspace disease is noted in the lower lungs bilaterally,
left greater than right and similar to prior. Tiny bilateral pleural
effusions. The visualized bony structures of the thorax show no
acute abnormality. Telemetry leads overlie the chest.
IMPRESSION: 1. Low volume film with stable exam.
2. Similar diffuse interstitial opacity with basilar
collapse/consolidative disease, left greater than right.
3. Tiny bilateral pleural effusions.

## 2020-11-09 IMAGING — DX DG CHEST 1V PORT
1 series · 1 of 1 positions shown · non-contrast
Comparison: 05/26/2020

CLINICAL DATA: Shortness of breath and hypertension

EXAM:
PORTABLE CHEST 1 VIEW

[chest ap]
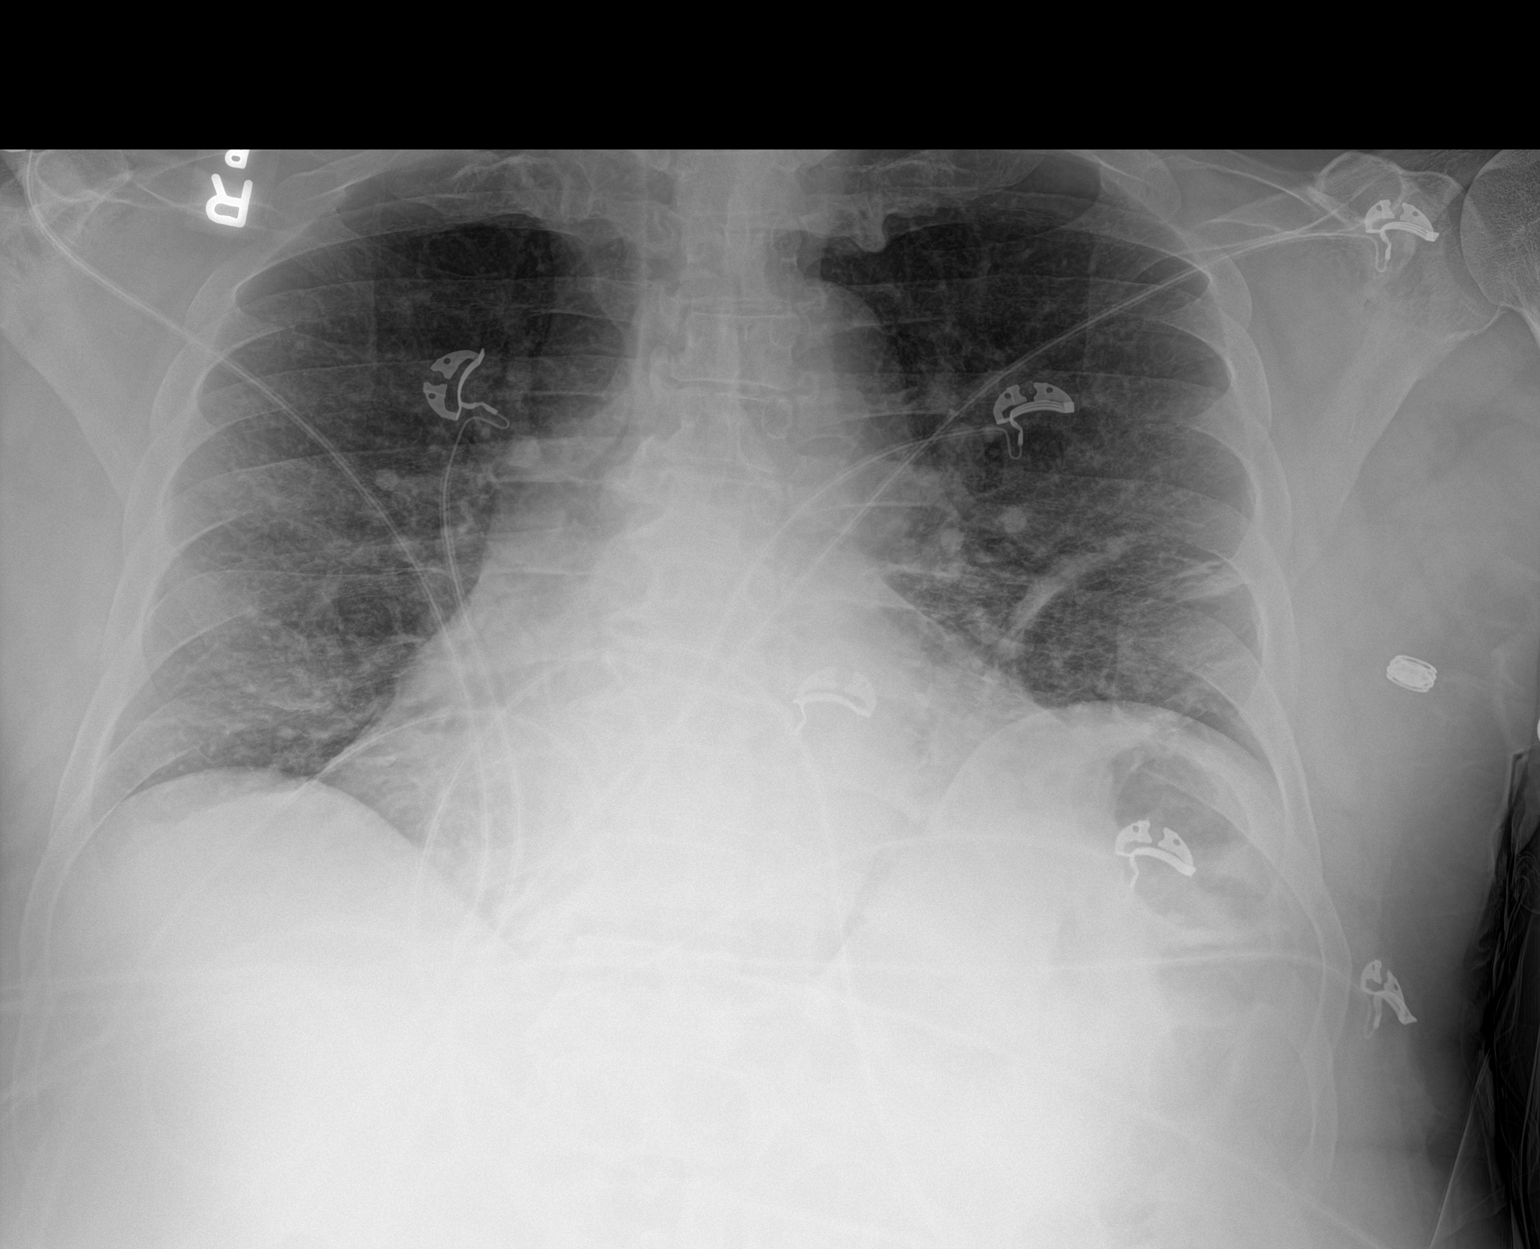

[1 of 1 positions shown; findings below may reference images not displayed]

FINDINGS: Cardiomegaly and vascular pedicle widening. Linear scarring at the
left base. Lung volumes are low and there is chronic interstitial
coarsening. No Kerley lines, effusion, or pneumothorax.

Artifact from EKG leads
IMPRESSION: 1. No acute finding when compared to prior.
2. Low volume chest with cardiomegaly and left pulmonary scarring.
